# Patient Record
Sex: Male | Born: 1975 | Marital: Married | Smoking: Current some day smoker
Health system: Southern US, Community
[De-identification: ages and names within clinical notes are randomized; demographics above are authoritative.]

## PROBLEM LIST (undated history)

## (undated) DIAGNOSIS — R739 Hyperglycemia, unspecified: Secondary | ICD-10-CM

## (undated) HISTORY — DX: Hyperglycemia, unspecified: R73.9

## (undated) HISTORY — PX: NO PAST SURGERIES: SHX2092

---

## 2013-10-24 ENCOUNTER — Emergency Department: Payer: Self-pay | Admitting: Emergency Medicine

## 2013-10-24 LAB — COMPREHENSIVE METABOLIC PANEL
ALBUMIN: 4.2 g/dL (ref 3.4–5.0)
Alkaline Phosphatase: 62 U/L
Anion Gap: 6 — ABNORMAL LOW (ref 7–16)
BUN: 16 mg/dL (ref 7–18)
Bilirubin,Total: 0.7 mg/dL (ref 0.2–1.0)
CALCIUM: 9 mg/dL (ref 8.5–10.1)
Chloride: 108 mmol/L — ABNORMAL HIGH (ref 98–107)
Co2: 25 mmol/L (ref 21–32)
Creatinine: 0.84 mg/dL (ref 0.60–1.30)
EGFR (African American): >60
EGFR (Non-African Amer.): >60
Glucose: 115 mg/dL — ABNORMAL HIGH (ref 65–99)
OSMOLALITY: 280 (ref 275–301)
Potassium: 3.6 mmol/L (ref 3.5–5.1)
SGOT(AST): 34 U/L (ref 15–37)
SGPT (ALT): 30 U/L (ref 12–78)
Sodium: 139 mmol/L (ref 136–145)
Total Protein: 8.2 g/dL (ref 6.4–8.2)

## 2013-10-24 LAB — CBC WITH DIFFERENTIAL/PLATELET
BASOS ABS: 0 10*3/uL (ref 0.0–0.1)
BASOS PCT: 0.6 %
Eosinophil #: 0 10*3/uL (ref 0.0–0.7)
Eosinophil %: 0.7 %
HCT: 46 % (ref 40.0–52.0)
HGB: 15 g/dL (ref 13.0–18.0)
LYMPHS ABS: 2.3 10*3/uL (ref 1.0–3.6)
Lymphocyte %: 33.3 %
MCH: 30.5 pg (ref 26.0–34.0)
MCHC: 32.6 g/dL (ref 32.0–36.0)
MCV: 94 fL (ref 80–100)
MONOS PCT: 7.7 %
Monocyte #: 0.5 x10 3/mm (ref 0.2–1.0)
NEUTROS ABS: 4 10*3/uL (ref 1.4–6.5)
Neutrophil %: 57.7 %
Platelet: 143 10*3/uL — ABNORMAL LOW (ref 150–440)
RBC: 4.93 10*6/uL (ref 4.40–5.90)
RDW: 13.6 % (ref 11.5–14.5)
WBC: 6.8 10*3/uL (ref 3.8–10.6)

## 2013-10-27 ENCOUNTER — Emergency Department: Payer: Self-pay | Admitting: General Practice

## 2013-11-05 ENCOUNTER — Ambulatory Visit: Payer: Self-pay | Admitting: General Practice

## 2013-11-22 ENCOUNTER — Ambulatory Visit: Payer: Self-pay | Admitting: General Practice

## 2013-12-06 ENCOUNTER — Ambulatory Visit: Payer: Self-pay | Admitting: General Practice

## 2014-01-17 ENCOUNTER — Ambulatory Visit: Payer: Self-pay | Admitting: General Practice

## 2014-04-25 ENCOUNTER — Ambulatory Visit: Payer: Self-pay | Admitting: General Practice

## 2014-08-10 ENCOUNTER — Ambulatory Visit: Payer: Self-pay | Admitting: Family Medicine

## 2014-12-05 ENCOUNTER — Encounter: Payer: Self-pay | Admitting: Family Medicine

## 2014-12-05 ENCOUNTER — Ambulatory Visit: Payer: Self-pay | Admitting: Family Medicine

## 2014-12-05 ENCOUNTER — Ambulatory Visit (INDEPENDENT_AMBULATORY_CARE_PROVIDER_SITE_OTHER): Payer: 59 | Admitting: Family Medicine

## 2014-12-05 VITALS — BP 119/70 | HR 89 | Temp 97.4°F | Resp 18 | Ht 70.0 in | Wt 243.7 lb

## 2014-12-05 DIAGNOSIS — R8281 Pyuria: Principal | ICD-10-CM

## 2014-12-05 DIAGNOSIS — R8271 Bacteriuria: Secondary | ICD-10-CM | POA: Insufficient documentation

## 2014-12-05 DIAGNOSIS — N390 Urinary tract infection, site not specified: Secondary | ICD-10-CM

## 2014-12-05 NOTE — Progress Notes (Signed)
Name: Alexander SneddonVictor Salam Valenzuela   MRN: [id number]    DOB: 01/14/1976   Date:12/05/2014       Progress Note  Subjective  Chief Complaint  Chief Complaint  Patient presents with  . 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 He also found '15-20 WBCs' in his urine at the same time. No blood. He has noticed increased frequency of urination but no blood in urine, no fevers and chills. No history of STIs, kidney stones, or pyelonephritis.   Past Medical History  Diagnosis Date  . Hyperglycemia     History reviewed. No pertinent past surgical history.  Family History  Problem Relation Age of Onset  . Stroke Father   . Hypertension Father     History   Social History  . Marital Status: Married    Spouse Name: N/A  . Number of Children: N/A  . Years of Education: N/A   Occupational History  . Not on file.   Social History Main Topics  . Smoking status: Former Smoker    Quit date: 05/09/2011  . Smokeless tobacco: Never Used  . Alcohol Use: 0.0 oz/week    0 Standard drinks or equivalent per week     Comment: occasional  . Drug Use: No  . Sexual Activity:    Partners: Female     Comment: Married   Other Topics Concern  . Not on file   Social History Narrative  . No narrative on file    No current outpatient prescriptions on file.  No Known Allergies   Review of Systems  Constitutional: Negative for fever, chills, weight loss and malaise/fatigue.  Genitourinary: Positive for frequency. Negative for dysuria, urgency, hematuria and flank pain.      Objective  Filed Vitals:   12/05/14 1142  BP: 119/70  Pulse: 89  Temp: 97.4 F (36.3 C)  TempSrc: Oral  Resp: 18  Height: 5\' 10"  (1.778 m)  Weight: 243 lb 11.2 oz (110.542 kg)  SpO2: 96%    Physical Exam  Constitutional: He is oriented to person, place, and time  and well-developed, well-nourished, and in no distress.  Cardiovascular: Normal rate and regular rhythm.   Pulmonary/Chest: Effort normal and breath sounds normal.  Abdominal: Soft. Bowel sounds are normal.  Neurological: He is alert and oriented to person, place, and time.  Skin: Skin is warm and dry.  Nursing note and vitals reviewed.    Assessment & Plan 1. Bacteriuria with pyuria We will obtain urinalysis and culture for evaluation of bacteria in the urine.  - Urinalysis, microscopic only - Urine Culture   Rivaan Kendall Asad A. Faylene KurtzShah [facility] 12/05/2014 12:08 PM

## 2014-12-06 LAB — URINALYSIS, MICROSCOPIC ONLY: Casts: NONE SEEN /lpf

## 2014-12-07 LAB — URINE CULTURE: Organism ID, Bacteria: NO GROWTH

## 2014-12-08 ENCOUNTER — Telehealth: Payer: Self-pay | Admitting: Family Medicine

## 2014-12-08 LAB — URINALYSIS, COMPLETE
Bilirubin, UA: NEGATIVE
Glucose, UA: NEGATIVE
Ketones, UA: NEGATIVE
Nitrite, UA: NEGATIVE
Protein, UA: NEGATIVE
RBC, UA: NEGATIVE
Specific Gravity, UA: 1.022 (ref 1.005–1.030)
Urobilinogen, Ur: 0.2 mg/dL (ref 0.2–1.0)
pH, UA: 6 (ref 5.0–7.5)

## 2014-12-08 LAB — MICROSCOPIC EXAMINATION: Casts: NONE SEEN /lpf

## 2014-12-08 NOTE — Telephone Encounter (Signed)
Pt would like test results

## 2014-12-08 NOTE — Telephone Encounter (Signed)
Results have been reported to patient 

## 2014-12-10 LAB — SPECIMEN STATUS REPORT

## 2014-12-19 ENCOUNTER — Ambulatory Visit (INDEPENDENT_AMBULATORY_CARE_PROVIDER_SITE_OTHER): Payer: 59 | Admitting: Family Medicine

## 2014-12-19 ENCOUNTER — Encounter: Payer: Self-pay | Admitting: Family Medicine

## 2014-12-19 VITALS — BP 122/69 | HR 88 | Temp 97.7°F | Resp 18 | Ht 70.0 in | Wt 244.9 lb

## 2014-12-19 DIAGNOSIS — N390 Urinary tract infection, site not specified: Secondary | ICD-10-CM

## 2014-12-19 DIAGNOSIS — R8281 Pyuria: Secondary | ICD-10-CM

## 2014-12-19 DIAGNOSIS — R0789 Other chest pain: Secondary | ICD-10-CM | POA: Diagnosis not present

## 2014-12-19 DIAGNOSIS — G8929 Other chronic pain: Secondary | ICD-10-CM | POA: Diagnosis not present

## 2014-12-19 DIAGNOSIS — R8271 Bacteriuria: Secondary | ICD-10-CM

## 2014-12-19 MED ORDER — NAPROXEN 500 MG PO TABS
500.0000 mg | ORAL_TABLET | Freq: Two times a day (BID) | ORAL | Status: DC
Start: 2014-12-19 — End: 2015-02-09

## 2014-12-19 NOTE — Progress Notes (Signed)
Name: Alexander Valenzuela   MRN: [id number]    DOB: 1975/09/23   Date:12/19/2014       Progress Note  Subjective  Chief Complaint  Chief Complaint  Patient presents with  . Follow-up    2 wk/ urine results  . Chest Pain    left side    Chest Pain  This is a chronic problem. The current episode started more than 1 month ago. The onset quality is gradual. The problem occurs intermittently. The pain is present in the lateral region (left antero-lateral chest, no radiation.). The patient is experiencing no pain. The quality of the pain is described as sharp. The pain does not radiate. Associated symptoms include back pain (hx of middle back pain.). Pertinent negatives include no cough, fever, headaches or shortness of breath. The pain is aggravated by exertion, lifting arm and movement (started the day he was working on the ceiling fan which involved keeping his arms raised and stretched for a long period.). He has tried analgesics (ASA ) for the symptoms. The treatment provided mild relief.  Pertinent negatives for past medical history include no CAD, no CHF and no PE. Prior diagnostic workup includes chest x-ray.   Abnormal Urinalysis Pt. Is here for follow up of abnormal urinalysis, performed 2 weeks ago, which showed 6-10 WBC/hpf and a few bacteria but Culture was negative. No blood, nitrites, or leukocytes in the urine.  He has no fevers, chills, but does complain of increased frequency of urination since last week, no hesitancy, no gross hematuria, no flank pain. In addition, he also complains of a less strong ejaculation than before going back some months.     Past Medical History  Diagnosis Date  . Hyperglycemia     History reviewed. No pertinent past surgical history.  Family History  Problem Relation Age of Onset  . Stroke Father   . Hypertension Father     History   Social History  . Marital Status: Married    Spouse Name: N/A  . Number of Children: N/A  . Years  of Education: N/A   Occupational History  . Not on file.   Social History Main Topics  . Smoking status: Former Smoker    Quit date: 05/09/2011  . Smokeless tobacco: Never Used  . Alcohol Use: 0.0 oz/week    0 Standard drinks or equivalent per week     Comment: occasional  . Drug Use: No  . Sexual Activity:    Partners: Female     Comment: Married   Other Topics Concern  . Not on file   Social History Narrative     Current outpatient prescriptions:  .  ASPIRIN CHILDRENS PO, Take by mouth., Disp: , Rfl:   No Known Allergies   Review of Systems  Constitutional: Negative for fever and chills.  Respiratory: Negative for cough and shortness of breath.   Cardiovascular: Positive for chest pain.  Genitourinary: Positive for urgency and frequency. Negative for dysuria, hematuria and flank pain.  Musculoskeletal: Positive for back pain (hx of middle back pain.).  Neurological: Negative for headaches.      Objective  Filed Vitals:   12/19/14 1107  BP: 122/69  Pulse: 88  Temp: 97.7 F (36.5 C)  TempSrc: Oral  Resp: 18  Height:  (1.778 m)  Weight: 244 lb 14.4 oz (111.086 kg)  SpO2: 95%    Physical Exam  Constitutional: He is well-developed, well-nourished, and in no distress.  Cardiovascular: Normal rate and regular rhythm.  Pulmonary/Chest: Effort normal and breath sounds normal.  Abdominal: Soft. Bowel sounds are normal.  Nursing note and vitals reviewed.   Assessment & Plan  1. Chronic chest wall pain On take intermittent left-sided chest wall pain with negative x-ray and rib series. We advised an EKG for evaluation of the heart but patient wants to wait before obtaining any further studies. Pain is intermittent and most likely musculoskeletal in nature.we will start patient on high-dose NSAID therapy for 10 days and follow-up. - naproxen (NAPROSYN) 500 MG tablet; Take 1 tablet (500 mg total) by mouth 2 (two) times daily with a meal.  Dispense: 20  tablet; Refill: 0  2. Bacteriuria with pyuria Recheck urinalysis and follow-up. If still shows persistent bacteriuria, may start him on an empiric antibiotic therapy. - Urinalysis, Routine w reflex microscopic  Andrian Urbach Asad A[facility] 12/19/2014 12:00 PM

## 2014-12-21 LAB — URINALYSIS, ROUTINE W REFLEX MICROSCOPIC
BILIRUBIN UA: NEGATIVE
GLUCOSE, UA: NEGATIVE
Ketones, UA: NEGATIVE
NITRITE UA: NEGATIVE
Protein, UA: NEGATIVE
RBC, UA: NEGATIVE
SPEC GRAV UA: 1.023 (ref 1.005–1.030)
Urobilinogen, Ur: 0.2 mg/dL (ref 0.2–1.0)
pH, UA: 6 (ref 5.0–7.5)

## 2014-12-21 LAB — MICROSCOPIC EXAMINATION: Casts: NONE SEEN /lpf

## 2014-12-22 ENCOUNTER — Encounter: Payer: 59 | Admitting: Family Medicine

## 2014-12-22 ENCOUNTER — Telehealth: Payer: Self-pay | Admitting: Family Medicine

## 2014-12-22 DIAGNOSIS — R8271 Bacteriuria: Secondary | ICD-10-CM

## 2014-12-22 NOTE — Telephone Encounter (Signed)
Urine culture has been collected per Dr. Sherryll Burger

## 2014-12-23 ENCOUNTER — Telehealth: Payer: Self-pay | Admitting: Family Medicine

## 2014-12-23 NOTE — Telephone Encounter (Signed)
Spoke to patient informed him that test results have not come back yet and also that Dr. Sherryll Burger is out of the office until Tuesday 12/27/2014

## 2014-12-23 NOTE — Telephone Encounter (Signed)
Checking status on culture results. Please return call [phone]

## 2014-12-27 ENCOUNTER — Ambulatory Visit (INDEPENDENT_AMBULATORY_CARE_PROVIDER_SITE_OTHER): Payer: 59 | Admitting: Family Medicine

## 2014-12-27 ENCOUNTER — Encounter: Payer: Self-pay | Admitting: Family Medicine

## 2014-12-27 VITALS — BP 122/60 | HR 84 | Temp 97.8°F | Resp 18 | Ht 70.0 in | Wt 245.9 lb

## 2014-12-27 DIAGNOSIS — Z0000 Encounter for general adult medical examination without abnormal findings: Secondary | ICD-10-CM | POA: Diagnosis not present

## 2014-12-27 DIAGNOSIS — N390 Urinary tract infection, site not specified: Secondary | ICD-10-CM | POA: Diagnosis not present

## 2014-12-27 DIAGNOSIS — R8281 Pyuria: Secondary | ICD-10-CM

## 2014-12-27 DIAGNOSIS — R8271 Bacteriuria: Secondary | ICD-10-CM

## 2014-12-27 MED ORDER — CIPROFLOXACIN HCL 500 MG PO TABS: 500.0000 mg | ORAL_TABLET | Freq: Two times a day (BID) | ORAL | Status: DC

## 2014-12-27 NOTE — Progress Notes (Signed)
Name: Alexander Valenzuela   MRN: [id number]    DOB: 07/15/1975   Date:12/27/2014       Progress Note  Subjective  Chief Complaint  Chief Complaint  Patient presents with  . Annual Exam    CPE    HPI Pt. Is here for a Complete Physical Exam. He feels well overall.  Past Medical History  Diagnosis Date  . Hyperglycemia     History reviewed. No pertinent past surgical history.  Family History  Problem Relation Age of Onset  . Stroke Father   . Hypertension Father     History   Social History  . Marital Status: Married    Spouse Name: N/A  . Number of Children: N/A  . Years of Education: N/A   Occupational History  . Not on file.   Social History Main Topics  . Smoking status: Former Smoker    Quit date: 05/09/2011  . Smokeless tobacco: Never Used  . Alcohol Use: 0.0 oz/week    0 Standard drinks or equivalent per week     Comment: occasional  . Drug Use: No  . Sexual Activity:    Partners: Female     Comment: Married   Other Topics Concern  . Not on file   Social History Narrative     Current outpatient prescriptions:  .  naproxen (NAPROSYN) 500 MG tablet, Take 1 tablet (500 mg total) by mouth 2 (two) times daily with a meal., Disp: 20 tablet, Rfl: 0 .  ASPIRIN CHILDRENS PO, Take by mouth., Disp: , Rfl:   No Known Allergies   Review of Systems  Constitutional: Negative for fever and chills.  HENT: Negative for congestion and sore throat.   Eyes: Negative for blurred vision and double vision.  Respiratory: Negative for cough, sputum production and shortness of breath.   Cardiovascular: Negative for chest pain and palpitations.  Gastrointestinal: Negative for heartburn, nausea, vomiting, abdominal pain, diarrhea and constipation.  Genitourinary: Positive for frequency. Negative for dysuria, hematuria and flank pain.  Musculoskeletal: Positive for back pain. Negative for myalgias and neck pain.  Skin: Negative for rash.  Neurological: Negative for  dizziness, seizures, loss of consciousness and headaches.  Endo/Heme/Allergies: Does not bruise/bleed easily.  Psychiatric/Behavioral: Negative for depression. The patient is not nervous/anxious.      Objective  Filed Vitals:   12/27/14 1118  BP: 122/60  Pulse: 84  Temp: 97.8 F (36.6 C)  TempSrc: Oral  Resp: 18  Height:  (1.778 m)  Weight: 245 lb 14.4 oz (111.54 kg)  SpO2: 96%    Physical Exam  Constitutional: He is oriented to person, place, and time and well-developed, well-nourished, and in no distress.  HENT:  Head: Normocephalic.  Eyes: Pupils are equal, round, and reactive to light.  Neck: Normal range of motion. Neck supple.  Cardiovascular: Normal rate and regular rhythm.   Pulmonary/Chest: Effort normal and breath sounds normal.  Abdominal: Soft. Bowel sounds are normal.  Genitourinary: Rectum normal and prostate normal.  Musculoskeletal: Normal range of motion.  Neurological: He is alert and oriented to person, place, and time.  Skin: Skin is warm and dry.  Nursing note and vitals reviewed.    Assessment & Plan  1. Annual physical exam  - CBC with Differential - Basic Metabolic Panel (BMET) - PSA - Vitamin D (25 hydroxy) - Hepatic function panel - TSH  2. Bacteriuria with pyuria Urine culture shows no growth. Patient will be started on Cipro 500 mg by mouth twice  a day and repeat urinalysis in 2 weeks. - ciprofloxacin (CIPRO) 500 MG tablet; Take 1 tablet (500 mg total) by mouth 2 (two) times daily.  Dispense: 6 tablet; Refill: 0  Kaylynne Andres Asad [facility] 12/27/2014 11:35 AM

## 2014-12-30 LAB — CBC WITH DIFFERENTIAL/PLATELET
BASOS ABS: 0 10*3/uL (ref 0.0–0.2)
Basos: 0 %
EOS (ABSOLUTE): 0 10*3/uL (ref 0.0–0.4)
Eos: 1 %
Hematocrit: 44.4 % (ref 37.5–51.0)
Hemoglobin: 15.2 g/dL (ref 12.6–17.7)
Immature Grans (Abs): 0 10*3/uL (ref 0.0–0.1)
Immature Granulocytes: 0 %
LYMPHS ABS: 2.2 10*3/uL (ref 0.7–3.1)
Lymphs: 39 %
MCH: 31 pg (ref 26.6–33.0)
MCHC: 34.2 g/dL (ref 31.5–35.7)
MCV: 91 fL (ref 79–97)
Monocytes Absolute: 0.5 10*3/uL (ref 0.1–0.9)
Monocytes: 8 %
Neutrophils Absolute: 2.9 10*3/uL (ref 1.4–7.0)
Neutrophils: 52 %
Platelets: 173 10*3/uL (ref 150–379)
RBC: 4.9 x10E6/uL (ref 4.14–5.80)
RDW: 13.3 % (ref 12.3–15.4)
WBC: 5.6 10*3/uL (ref 3.4–10.8)

## 2014-12-31 LAB — BASIC METABOLIC PANEL
BUN/Creatinine Ratio: 15 (ref 8–19)
BUN: 12 mg/dL (ref 6–20)
CHLORIDE: 101 mmol/L (ref 97–108)
CO2: 25 mmol/L (ref 18–29)
Calcium: 9.4 mg/dL (ref 8.7–10.2)
Creatinine, Ser: 0.8 mg/dL (ref 0.76–1.27)
GFR calc Af Amer: 130 mL/min/{1.73_m2} (ref >59)
GFR calc non Af Amer: 113 mL/min/{1.73_m2} (ref >59)
GLUCOSE: 110 mg/dL — AB (ref 65–99)
Potassium: 4.5 mmol/L (ref 3.5–5.2)
Sodium: 140 mmol/L (ref 134–144)

## 2014-12-31 LAB — HEPATIC FUNCTION PANEL
ALBUMIN: 4.7 g/dL (ref 3.5–5.5)
ALT: 28 IU/L (ref 0–44)
AST: 21 IU/L (ref 0–40)
Alkaline Phosphatase: 71 IU/L (ref 39–117)
BILIRUBIN, DIRECT: 0.14 mg/dL (ref 0.00–0.40)
Bilirubin Total: 0.5 mg/dL (ref 0.0–1.2)
TOTAL PROTEIN: 7.1 g/dL (ref 6.0–8.5)

## 2014-12-31 LAB — PSA: Prostate Specific Ag, Serum: 0.5 ng/mL (ref 0.0–4.0)

## 2014-12-31 LAB — TSH: TSH: 1.4 u[IU]/mL (ref 0.450–4.500)

## 2014-12-31 LAB — VITAMIN D 25 HYDROXY (VIT D DEFICIENCY, FRACTURES): VIT D 25 HYDROXY: 16 ng/mL — AB (ref 30.0–100.0)

## 2015-01-04 ENCOUNTER — Other Ambulatory Visit: Payer: Self-pay | Admitting: Family Medicine

## 2015-01-04 MED ORDER — VITAMIN D (ERGOCALCIFEROL) 1.25 MG (50000 UNIT) PO CAPS: 50000.0000 [IU] | ORAL_CAPSULE | ORAL | Status: DC

## 2015-01-04 NOTE — Telephone Encounter (Signed)
Per Dr. Sherryll Burger a prescription for Vitamin D 50,000 units has been sent to [facility] [address]

## 2015-01-10 ENCOUNTER — Encounter: Payer: Self-pay | Admitting: Family Medicine

## 2015-01-10 ENCOUNTER — Other Ambulatory Visit: Payer: Self-pay | Admitting: Family Medicine

## 2015-01-10 ENCOUNTER — Encounter: Payer: 59 | Admitting: Family Medicine

## 2015-01-10 DIAGNOSIS — R8281 Pyuria: Principal | ICD-10-CM

## 2015-01-10 DIAGNOSIS — R8271 Bacteriuria: Secondary | ICD-10-CM

## 2015-01-10 NOTE — Progress Notes (Signed)
This encounter was created in error - please disregard.

## 2015-01-17 ENCOUNTER — Encounter: Payer: Self-pay | Admitting: Family Medicine

## 2015-02-09 ENCOUNTER — Other Ambulatory Visit: Payer: Self-pay | Admitting: Family Medicine

## 2015-02-09 ENCOUNTER — Encounter: Payer: Self-pay | Admitting: Family Medicine

## 2015-02-09 ENCOUNTER — Ambulatory Visit (INDEPENDENT_AMBULATORY_CARE_PROVIDER_SITE_OTHER): Payer: 59 | Admitting: Family Medicine

## 2015-02-09 VITALS — BP 123/66 | HR 107 | Temp 98.4°F | Resp 19 | Ht 70.0 in | Wt 249.6 lb

## 2015-02-09 DIAGNOSIS — N390 Urinary tract infection, site not specified: Secondary | ICD-10-CM

## 2015-02-09 DIAGNOSIS — R8271 Bacteriuria: Secondary | ICD-10-CM

## 2015-02-09 NOTE — Progress Notes (Signed)
Name: Alexander Valenzuela   MRN: [id number]    DOB: Feb 24, 1976   Date:02/09/2015       Progress Note  Subjective  Chief Complaint  Chief Complaint  Patient presents with  . Follow-up    Urine culture    HPI Pt. With complaints of discomfort with urination, present for some time, couples with burning at the end of urination at times.  He is a Consulting civil engineer at [employer] and tested his urine culture, which showed Corynebacterium urealyticum as the causative organism after 48 hour growth cycle.Gram stain showed V and L-shaped gram + rods. [facility] is requesting a repeat urine culture for positive identification of the organism  Past Medical History  Diagnosis Date  . Hyperglycemia     History reviewed. No pertinent past surgical history.  Family History  Problem Relation Age of Onset  . Stroke Father   . Hypertension Father     Social History   Social History  . Marital Status: Married    Spouse Name: N/A  . Number of Children: N/A  . Years of Education: N/A   Occupational History  . Not on file.   Social History Main Topics  . Smoking status: Former Smoker    Quit date: 05/09/2011  . Smokeless tobacco: Never Used  . Alcohol Use: 0.0 oz/week    0 Standard drinks or equivalent per week     Comment: occasional  . Drug Use: No  . Sexual Activity:    Partners: Female     Comment: Married   Other Topics Concern  . Not on file   Social History Narrative    Current outpatient prescriptions:  .  ASPIRIN CHILDRENS PO, Take by mouth., Disp: , Rfl:  .  Vitamin D, Ergocalciferol, (DRISDOL) 50000 UNITS CAPS capsule, Take 1 capsule (50,000 Units total) by mouth once a week. For 12 weeks, Disp: 12 capsule, Rfl: 0 .  ciprofloxacin (CIPRO) 500 MG tablet, Take 1 tablet (500 mg total) by mouth 2 (two) times daily. (Patient not taking: Reported on 02/09/2015), Disp: 6 tablet, Rfl: 0 .  naproxen (NAPROSYN) 500 MG tablet, Take 1 tablet (500 mg total) by mouth 2 (two) times daily with a meal.  (Patient not taking: Reported on 02/09/2015), Disp: 20 tablet, Rfl: 0  No Known Allergies   Review of Systems  Constitutional: Negative for fever and chills.  Genitourinary: Positive for dysuria, frequency and flank pain. Negative for hematuria.   Objective  Filed Vitals:   02/09/15 1434  BP: 123/66  Pulse: 107  Temp: 98.4 F (36.9 C)  TempSrc: Oral  Resp: 19  Height:  (1.778 m)  Weight: 249 lb 9.6 oz (113.218 kg)  SpO2: 95%    Physical Exam  Constitutional: He is well-developed, well-nourished, and in no distress.  Cardiovascular: Normal rate and regular rhythm.   Pulmonary/Chest: Effort normal and breath sounds normal.  Abdominal: Soft. Bowel sounds are normal. There is no CVA tenderness.  Nursing note and vitals reviewed.  Assessment & Plan  1. Bacteriuria Obtain urinalysis and culture for evaluation of the suspected bacterium. - Urinalysis, Routine w reflex microscopic - Urine Culture   Syed Asad A[facility] 02/09/2015 2:48 PM

## 2015-02-10 LAB — URINALYSIS, ROUTINE W REFLEX MICROSCOPIC
Bilirubin, UA: NEGATIVE
GLUCOSE, UA: NEGATIVE
Ketones, UA: NEGATIVE
Leukocytes, UA: NEGATIVE
NITRITE UA: NEGATIVE
PH UA: 5.5 (ref 5.0–7.5)
Protein, UA: NEGATIVE
RBC, UA: NEGATIVE
Specific Gravity, UA: 1.024 (ref 1.005–1.030)
UUROB: 0.2 mg/dL (ref 0.2–1.0)

## 2015-02-11 LAB — URINE CULTURE: Organism ID, Bacteria: NO GROWTH

## 2015-02-16 ENCOUNTER — Telehealth: Payer: Self-pay | Admitting: Family Medicine

## 2015-02-16 NOTE — Telephone Encounter (Signed)
Pt would like a call back as soon as you can. He says he has been dealing with same symptoms for 2 mths and its not getting better. He would like a call back as soon as possible.

## 2015-02-17 NOTE — Telephone Encounter (Signed)
Called and left a voice message

## 2015-02-17 NOTE — Telephone Encounter (Signed)
Routed to Dr. Shah for advice  

## 2015-02-22 ENCOUNTER — Other Ambulatory Visit: Payer: Self-pay | Admitting: Family Medicine

## 2015-02-22 DIAGNOSIS — R8271 Bacteriuria: Secondary | ICD-10-CM

## 2015-02-22 DIAGNOSIS — R8281 Pyuria: Principal | ICD-10-CM

## 2015-02-23 ENCOUNTER — Other Ambulatory Visit: Payer: Self-pay | Admitting: Family Medicine

## 2015-02-25 LAB — URINE CULTURE: ORGANISM ID, BACTERIA: NO GROWTH

## 2015-03-10 ENCOUNTER — Ambulatory Visit (INDEPENDENT_AMBULATORY_CARE_PROVIDER_SITE_OTHER): Payer: 59 | Admitting: Family Medicine

## 2015-03-10 ENCOUNTER — Encounter: Payer: Self-pay | Admitting: Family Medicine

## 2015-03-10 VITALS — BP 122/75 | HR 89 | Temp 97.7°F | Resp 18 | Ht 70.0 in | Wt 252.5 lb

## 2015-03-10 DIAGNOSIS — E669 Obesity, unspecified: Secondary | ICD-10-CM

## 2015-03-10 NOTE — Progress Notes (Signed)
Name: Alexander SneddonVictor Salam Valenzuela   MRN: [id number]    DOB: 11/17/1975   Date:03/10/2015       Progress Note  Subjective  Chief Complaint  Chief Complaint  Patient presents with  . Advice Only    Paperwork    HPI  Pt. Is here for appealing the [facility]'s BMI wellness goal. Current weight is 252lbs and BMI of 36.23. He does not eat out or have fast food, his wife cooks for him. He is busy with FT school and employment which leaves him with very little time for any physical activity or exercise. He is going to start with an exercise regimen soon.  Past Medical History  Diagnosis Date  . Hyperglycemia     History reviewed. No pertinent past surgical history.  Family History  Problem Relation Age of Onset  . Stroke Father   . Hypertension Father     Social History   Social History  . Marital Status: Married    Spouse Name: N/A  . Number of Children: N/A  . Years of Education: N/A   Occupational History  . Not on file.   Social History Main Topics  . Smoking status: Former Smoker    Quit date: 05/09/2011  . Smokeless tobacco: Never Used  . Alcohol Use: 0.0 oz/week    0 Standard drinks or equivalent per week     Comment: occasional  . Drug Use: No  . Sexual Activity:    Partners: Female     Comment: Married   Other Topics Concern  . Not on file   Social History Narrative     Current outpatient prescriptions:  .  ASPIRIN CHILDRENS PO, Take by mouth., Disp: , Rfl:  .  Vitamin D, Ergocalciferol, (DRISDOL) 50000 UNITS CAPS capsule, Take 1 capsule (50,000 Units total) by mouth once a week. For 12 weeks, Disp: 12 capsule, Rfl: 0  No Known Allergies   Review of Systems  Constitutional: Negative for fever, chills and weight loss.  Cardiovascular: Negative for chest pain.   Objective  Filed Vitals:   03/10/15 0932  BP: 122/75  Pulse: 89  Temp: 97.7 F (36.5 C)  TempSrc: Oral  Resp: 18  Height: 5\' 10"  (1.778 m)  Weight: 252 lb 8 oz (114.533 kg)  SpO2: 97%     Physical Exam  Constitutional: He is oriented to person, place, and time and well-developed, well-nourished, and in no distress.  Cardiovascular: Normal rate and regular rhythm.   No murmur heard. Pulmonary/Chest: Effort normal and breath sounds normal. He has no wheezes. He has no rales.  Neurological: He is alert and oriented to person, place, and time.  Skin: Skin is warm and dry.  Nursing note and vitals reviewed.   Assessment & Plan  1. Obesity (BMI 35.0-39.9 without comorbidity) (HCC) Recommended that patient start a program of graduated physical activity including walking,, swimming, treadmill etc. Continue current diet. Recheck in 3 months.   Shawne Eskelson Asad A. Faylene KurtzShah [facility] 03/10/2015 9:52 AM

## 2015-06-21 ENCOUNTER — Ambulatory Visit: Payer: 59 | Admitting: Family Medicine

## 2015-06-21 NOTE — Progress Notes (Signed)
Name: Alexander Valenzuela   MRN: [id number]    DOB: 1975/11/06   Date:06/21/2015       Progress Note  Subjective  Chief Complaint  Chief Complaint  Patient presents with  . discuss immunizations    HPI    Past Medical History  Diagnosis Date  . Hyperglycemia     No past surgical history on file.  Family History  Problem Relation Age of Onset  . Stroke Father   . Hypertension Father     Social History   Social History  . Marital Status: Married    Spouse Name: N/A  . Number of Children: N/A  . Years of Education: N/A   Occupational History  . Not on file.   Social History Main Topics  . Smoking status: Former Smoker    Quit date: 05/09/2011  . Smokeless tobacco: Never Used  . Alcohol Use: 0.0 oz/week    0 Standard drinks or equivalent per week     Comment: occasional  . Drug Use: No  . Sexual Activity:    Partners: Female     Comment: Married   Other Topics Concern  . Not on file   Social History Narrative     Current outpatient prescriptions:  .  ASPIRIN CHILDRENS PO, Take by mouth., Disp: , Rfl:  .  Vitamin D, Ergocalciferol, (DRISDOL) 50000 UNITS CAPS capsule, Take 1 capsule (50,000 Units total) by mouth once a week. For 12 weeks, Disp: 12 capsule, Rfl: 0  No Known Allergies   ROS    Objective  Filed Vitals:   06/21/15 1319  BP: 122/69  Pulse: 98  Temp: 98.4 F (36.9 C)  Resp: 18  Height:  (1.778 m)  Weight: 250 lb 9 oz (113.654 kg)  SpO2: 96%    Physical Exam     No results found for this or any previous visit (from the past 2160 hour(s)).   Assessment & Plan  There are no diagnoses linked to this encounter.  Coleman Kalas Asad A. [facility] 06/21/2015 1:35 PM This encounter was created in error - please disregard.

## 2015-07-18 ENCOUNTER — Ambulatory Visit: Payer: 59 | Admitting: Obstetrics and Gynecology

## 2015-07-19 ENCOUNTER — Encounter: Payer: Self-pay | Admitting: Obstetrics and Gynecology

## 2015-07-19 ENCOUNTER — Ambulatory Visit (INDEPENDENT_AMBULATORY_CARE_PROVIDER_SITE_OTHER): Payer: 59 | Admitting: Obstetrics and Gynecology

## 2015-07-19 VITALS — BP 120/76 | HR 92 | Resp 16 | Ht 70.0 in | Wt 248.1 lb

## 2015-07-19 DIAGNOSIS — R3911 Hesitancy of micturition: Secondary | ICD-10-CM | POA: Diagnosis not present

## 2015-07-19 LAB — BLADDER SCAN AMB NON-IMAGING: Scan Result: 108

## 2015-07-19 NOTE — Progress Notes (Signed)
07/19/2015 2:32 PM   Alexander Valenzuela July 22, 1975 [id number]  Referring provider: Ellyn Hack, MD [address] [place], [place] [address]  Chief Complaint  Patient presents with  . Urinary hesitancy  . Establish Care    HPI: Patient is a 40 year old male presenting today with complaints of weak urinary stream. He reports that he is fixed experiencing a weak urinary stream for the last few years and also occasionally experiences postvoid dribbling. He very rarely has some leakage (few drops) associated with urge incontinence.  Additional complaints include some decreased ejaculate volume. Patient reports that he has semen analysis performed at his wife's GYN provider's office and states that his sperm counts for normal but his volume was low. He is unsure of the exact results. There are no available reports at this time.  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 He states that he cultured his own urine and left it in daily to 48 hours and a groove a slow growing bacteria greater than 100,000 colonies. He states that he has been to see his primary care provider numerous times and had multiple urine cultures sent to lab core but states that they never incubated his urine long enough even when specifically requested. He is concerned that he may have a urinary tract infection and his cultures are not indicative of this because they are not allow to daily long enough.  I-PSS: 13 QOL: 3 mixed    PMH: Past Medical History  Diagnosis Date  . Hyperglycemia     Surgical History: History reviewed. No pertinent past surgical history.  Home Medications:    Medication List       This list is accurate as of: 07/19/15 11:59 PM.  Always use your most recent med list.               ASPIRIN CHILDRENS PO  Take by mouth.     Vitamin D (Ergocalciferol) 50000 units Caps capsule  Commonly known as:  DRISDOL  Take 1 capsule (50,000 Units  total) by mouth once a week. For 12 weeks        Allergies: No Known Allergies  Family History: Family History  Problem Relation Age of Onset  . Stroke Father   . Hypertension Father     Social History:  reports that he quit smoking about 4 years ago. He has never used smokeless tobacco. He reports that he drinks alcohol. He reports that he does not use illicit drugs.  ROS: UROLOGY Frequent Urination?: No Hard to postpone urination?: No Burning/pain with urination?: No Get up at night to urinate?: No Leakage of urine?: Yes Urine stream starts and stops?: No Trouble starting stream?: No Do you have to strain to urinate?: Yes Blood in urine?: No Urinary tract infection?: No Sexually transmitted disease?: No Injury to kidneys or bladder?: No Painful intercourse?: No Weak stream?: Yes Erection problems?: No Penile pain?: No  Gastrointestinal Nausea?: No Vomiting?: No Indigestion/heartburn?: No Diarrhea?: No Constipation?: No  Constitutional Fever: No Night sweats?: No Weight loss?: No Fatigue?: No  Skin Skin rash/lesions?: No Itching?: No  Eyes Blurred vision?: No Double vision?: No  Ears/Nose/Throat Sore throat?: No Sinus problems?: No  Hematologic/Lymphatic Swollen glands?: No Easy bruising?: No  Cardiovascular Leg swelling?: No Chest pain?: No  Respiratory Cough?: No Shortness of breath?: No  Endocrine Excessive thirst?: No  Musculoskeletal Back pain?: Yes Joint pain?: No  Neurological Headaches?: No Dizziness?: No  Psychologic  Depression?: No Anxiety?: No  Physical Exam: BP 120/76 mmHg  Pulse 92  Resp 16  Ht  (1.778 m)  Wt 248 lb 1.6 oz (112.537 kg)  BMI 35.60 kg/m2  Constitutional:  Alert and oriented, No acute distress. HEENT: [place] AT, moist mucus membranes.  Trachea midline, no masses. Cardiovascular: No clubbing, cyanosis, or edema. Respiratory: Normal respiratory effort, no increased work of breathing. GI:  Abdomen is soft, nontender, nondistended, no abdominal masses GU: No CVA tenderness.   straight phallus, testicles descended bilaterally without palpable masses or tenderness DRE: small, smooth, non tender prostate Skin: No rashes, bruises or suspicious lesions. Lymph: No cervical or inguinal adenopathy. Neurologic: Grossly intact, no focal deficits, moving all 4 extremities. Psychiatric: Normal mood and affect.  Laboratory Data:  Lab Results  Component Value Date   WBC 5.6 12/30/2014   HGB 15.0 10/24/2013   HCT 44.4 12/30/2014   MCV 91 12/30/2014   PLT 173 12/30/2014    Lab Results  Component Value Date   CREATININE 0.80 12/30/2014    No results found for: PSA  No results found for: TESTOSTERONE  No results found for: HGBA1C  Urinalysis    Component Value Date/Time   GLUCOSEU Negative 07/19/2015 1449   BILIRUBINUR Negative 07/19/2015 1449   NITRITE Negative 07/19/2015 1449   LEUKOCYTESUR Negative 07/19/2015 1449    Pertinent Imaging:   Assessment & Plan:    1. Urinary hesitancy-  Rapaflo prescribed today. Recheck 2 weeks and UROFLOW.   - Urinalysis, Complete - BLADDER SCAN AMB NON-IMAGING  Requesting patient obtain a semen analysis report for review. He may need a prostate ultrasound for further evaluation of possible ejaculatory duct obstruction.  I personally called and verified with Levita at [facility] that patient's urine culture will be incubated for at least 48hrs if it was written on requisitition form.     Return in about 2 weeks (around 08/02/2015) for recheck weaks tream/UROFLOW.  These notes generated with voice recognition software. I apologize for typographical errors.  Earlie Lou, FNP  [facility] [address] [place], [place] [address] [phone]

## 2015-07-20 ENCOUNTER — Telehealth: Payer: Self-pay | Admitting: Obstetrics and Gynecology

## 2015-07-20 LAB — URINALYSIS, COMPLETE
Bilirubin, UA: NEGATIVE
Glucose, UA: NEGATIVE
KETONES UA: NEGATIVE
LEUKOCYTES UA: NEGATIVE
NITRITE UA: NEGATIVE
PH UA: 5.5 (ref 5.0–7.5)
RBC UA: NEGATIVE
Specific Gravity, UA: >1.03 — ABNORMAL HIGH (ref 1.005–1.030)
Urobilinogen, Ur: 0.2 mg/dL (ref 0.2–1.0)

## 2015-07-20 LAB — MICROSCOPIC EXAMINATION

## 2015-07-20 NOTE — Telephone Encounter (Signed)
We please call patient and see if he can get a copy of his previous semen analysis results and bring them with him to his follow-up appointment for [place] to review. Thanks

## 2015-07-21 LAB — CULTURE, URINE COMPREHENSIVE

## 2015-07-21 NOTE — Telephone Encounter (Signed)
LMOM

## 2015-07-21 NOTE — Telephone Encounter (Signed)
Spoke with pt in reference to semen analysis. Pt stated that he would bring them to his next appt.

## 2015-07-25 ENCOUNTER — Telehealth: Payer: Self-pay

## 2015-07-25 NOTE — Telephone Encounter (Signed)
Spoke with pt in reference to -ucx. Pt voiced understanding.  

## 2015-07-25 NOTE — Telephone Encounter (Signed)
Not able to leave a message due to box full.

## 2015-07-25 NOTE — Telephone Encounter (Signed)
-----   Message from Fernanda DrumLindsay C Overton, FNP sent at 07/25/2015 10:45 AM EST ----- Please notify patient that his urine culture was negative for bacterial growth.

## 2015-08-02 ENCOUNTER — Ambulatory Visit (INDEPENDENT_AMBULATORY_CARE_PROVIDER_SITE_OTHER): Payer: 59 | Admitting: Obstetrics and Gynecology

## 2015-08-02 ENCOUNTER — Ambulatory Visit: Payer: 59 | Admitting: Obstetrics and Gynecology

## 2015-08-02 ENCOUNTER — Encounter: Payer: Self-pay | Admitting: Obstetrics and Gynecology

## 2015-08-02 VITALS — BP 108/76 | HR 80 | Ht 68.0 in | Wt 251.0 lb

## 2015-08-02 DIAGNOSIS — N538 Other male sexual dysfunction: Secondary | ICD-10-CM | POA: Diagnosis not present

## 2015-08-02 DIAGNOSIS — R3912 Poor urinary stream: Secondary | ICD-10-CM | POA: Diagnosis not present

## 2015-08-02 DIAGNOSIS — R868 Other abnormal findings in specimens from male genital organs: Secondary | ICD-10-CM

## 2015-08-02 NOTE — Progress Notes (Signed)
Uroflow  Peak Flow: 3.240ml Average Flow: 3.748ml Voided Volume: 193.360ml Voiding Time: 74.5sec Flow Time: 51.0sec Time to Peak Flow: 3.8sec

## 2015-08-02 NOTE — Patient Instructions (Signed)
Cystoscopy  Cystoscopy is a procedure that is used to help your caregiver diagnose and sometimes treat conditions that affect your lower urinary tract. Your lower urinary tract includes your bladder and the tube through which urine passes from your bladder out of your body (urethra). Cystoscopy is performed with a thin, tube-shaped instrument (cystoscope). The cystoscope has lenses and a light at the end so that your caregiver can see inside your bladder. The cystoscope is inserted at the entrance of your urethra. Your caregiver guides it through your urethra and into your bladder. There are two main types of cystoscopy:  · Flexible cystoscopy (with a flexible cystoscope).  · Rigid cystoscopy (with a rigid cystoscope).  Cystoscopy may be recommended for many conditions, including:  · Urinary tract infections.  · Blood in your urine (hematuria).  · Loss of bladder control (urinary incontinence) or overactive bladder.  · Unusual cells found in a urine sample.  · Urinary blockage.  · Painful urination.  Cystoscopy may also be done to remove a sample of your tissue to be checked under a microscope (biopsy). It may also be done to remove or destroy bladder stones.  LET YOUR CAREGIVER KNOW ABOUT:  · Allergies to food or medicine.  · Medicines taken, including vitamins, herbs, eyedrops, over-the-counter medicines, and creams.  · Use of steroids (by mouth or creams).  · Previous problems with anesthetics or numbing medicines.  · History of bleeding problems or blood clots.  · Previous surgery.  · Other health problems, including diabetes and kidney problems.  · Possibility of pregnancy, if this applies.  PROCEDURE  The area around the opening to your urethra will be cleaned. A medicine to numb your urethra (local anesthetic) is used. If a tissue sample or stone is removed during the procedure, you may be given a medicine to make you sleep (general anesthetic).  Your caregiver will gently insert the tip of the cystoscope  into your urethra. The cystoscope will be slowly glided through your urethra and into your bladder. Sterile fluid will flow through the cystoscope and into your bladder. The fluid will expand and stretch your bladder. This gives your caregiver a better view of your bladder walls. The procedure lasts about 15-20 minutes.  AFTER THE PROCEDURE  If a local anesthetic is used, you will be allowed to go home as soon as you are ready. If a general anesthetic is used, you will be taken to a recovery area until you are stable. You may have temporary bleeding and burning on urination.     This information is not intended to replace advice given to you by your health care provider. Make sure you discuss any questions you have with your health care provider.     Document Released: 05/03/2000 Document Revised: 05/27/2014 Document Reviewed: 10/28/2011  Elsevier Interactive Patient Education ©2016 [employer]vier Inc[employer]

## 2015-08-02 NOTE — Progress Notes (Signed)
11:53 AM   [facility] FerraraVictor Salam [facility] 03/19/1976 [id number]  Referring provider: Ellyn HackSyed Asad A Shah, MD [address] [place], [place] [id number]  Chief Complaint  Patient presents with  . Uroflow    HPI: Patient is a 40 year old male presenting today with complaints of weak urinary stream. He reports that he is fixed experiencing a weak urinary stream for the last few years and also occasionally experiences postvoid dribbling. He very rarely has some leakage (few drops) associated with urge incontinence.  Additional complaints include some decreased ejaculate volume. Patient reports that he has semen analysis performed at his wife's GYN provider's office and states that his sperm counts for normal but his volume was low. He is unsure of the exact results. There are no available reports at this time.  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 He states that he cultured his own urine and left it in daily to 48 hours and a groove a slow growing bacteria greater than 100,000 colonies. He states that he has been to see his primary care provider numerous times and had multiple urine cultures sent to lab core but states that they never incubated his urine long enough even when specifically requested. He is concerned that he may have a urinary tract infection and his cultures are not indicative of this because they are not allow to daily long enough.  I-PSS: 13 QOL: 3 mixed   Interval History 08/02/15  Patient presents today for uroflow evaluation and follow-up for weak urinary stream and decreased ejaculate volume. He has brought his semen analysis with him today. He reports no change in symptoms since his last visit. He did experience retrograde ejaculation when he was taking Rapaflo and no improvement in his urinary symptoms.  PMH: Past Medical History  Diagnosis Date  . Hyperglycemia     Surgical History: No past surgical history on file.  Home  Medications:    Medication List       This list is accurate as of: 08/02/15 11:53 AM.  Always use your most recent med list.               ASPIRIN CHILDRENS PO  Take by mouth. Reported on 08/02/2015     Vitamin D (Ergocalciferol) 50000 units Caps capsule  Commonly known as:  DRISDOL  Take 1 capsule (50,000 Units total) by mouth once a week. For 12 weeks        Allergies: No Known Allergies  Family History: Family History  Problem Relation Age of Onset  . Stroke Father   . Hypertension Father     Social History:  reports that he quit smoking about 4 years ago. He has never used smokeless tobacco. He reports that he drinks alcohol. He reports that he does not use illicit drugs.  ROS: UROLOGY Frequent Urination?: No Hard to postpone urination?: No Burning/pain with urination?: No Get up at night to urinate?: No Leakage of urine?: No Urine stream starts and stops?: No Trouble starting stream?: No Do you have to strain to urinate?: No Blood in urine?: No Urinary tract infection?: No Sexually transmitted disease?: No Injury to kidneys or bladder?: No Painful intercourse?: No Weak stream?: Yes Erection problems?: No Penile pain?: No  Gastrointestinal Nausea?: No Vomiting?: No Indigestion/heartburn?: No Diarrhea?: No Constipation?: No  Constitutional Fever: No Night sweats?: No Weight loss?: No Fatigue?: No  Skin Skin rash/lesions?: No Itching?: No  Eyes Blurred vision?: No Double vision?: No  Ears/Nose/Throat Sore throat?: No Sinus problems?: No  Hematologic/Lymphatic Swollen glands?: No Easy bruising?: No  Cardiovascular Leg swelling?: No Chest pain?: No  Respiratory Cough?: No Shortness of breath?: No  Endocrine Excessive thirst?: No  Musculoskeletal Back pain?: Yes Joint pain?: No  Neurological Headaches?: No Dizziness?: No  Psychologic Depression?: No Anxiety?: No  Physical Exam: BP 108/76 mmHg  Pulse 80  Ht   (1.727 m)  Wt 251 lb (113.853 kg)  BMI 38.17 kg/m2  Constitutional:  Alert and oriented, No acute distress. HEENT: [place] AT, moist mucus membranes.  Trachea midline, no masses. Cardiovascular: No clubbing, cyanosis, or edema. Respiratory: Normal respiratory effort, no increased work of breathing. GI: Abdomen is soft, nontender, nondistended, no abdominal masses  Skin: No rashes, bruises or suspicious lesions. Neurologic: Grossly intact, no focal deficits, moving all 4 extremities. Psychiatric: Normal mood and affect.  Laboratory Data:  Lab Results  Component Value Date   WBC 5.6 12/30/2014   HGB 15.0 10/24/2013   HCT 44.4 12/30/2014   MCV 91 12/30/2014   PLT 173 12/30/2014    Lab Results  Component Value Date   CREATININE 0.80 12/30/2014    No results found for: PSA  No results found for: TESTOSTERONE  No results found for: HGBA1C  Urinalysis    Component Value Date/Time   GLUCOSEU Negative 07/19/2015 1449   BILIRUBINUR Negative 07/19/2015 1449   NITRITE Negative 07/19/2015 1449   LEUKOCYTESUR Negative 07/19/2015 1449    Pertinent Imaging:   Assessment & Plan:    1. Urinary hesitancy-    -Uroflow demonstrating obstructive pattern with peak and average flow of only .  2. Decreased ejaculate volume-   -0.95ml -per guidelines low ejaculate volumes indicative of possible urethral stricture vs obstructive ejaculatory duct  Will proceed with cystoscopy for further evaluation of weak stream and decreased ejaculate volume.  Return for schedule cystoscopy possible urethral stricture.  These notes generated with voice recognition software. I apologize for typographical errors.  Earlie Lou, FNP  [facility] [address] [place], [place] [address] [phone]

## 2015-08-15 ENCOUNTER — Ambulatory Visit (INDEPENDENT_AMBULATORY_CARE_PROVIDER_SITE_OTHER): Payer: 59 | Admitting: Urology

## 2015-08-15 VITALS — BP 121/78 | HR 79 | Ht 68.0 in | Wt 249.0 lb

## 2015-08-15 DIAGNOSIS — N358 Other urethral stricture: Secondary | ICD-10-CM

## 2015-08-15 LAB — URINALYSIS, COMPLETE
Bilirubin, UA: NEGATIVE
GLUCOSE, UA: NEGATIVE
Ketones, UA: NEGATIVE
NITRITE UA: NEGATIVE
RBC, UA: NEGATIVE
Specific Gravity, UA: 1.025 (ref 1.005–1.030)
Urobilinogen, Ur: 0.2 mg/dL (ref 0.2–1.0)
pH, UA: 6 (ref 5.0–7.5)

## 2015-08-15 LAB — MICROSCOPIC EXAMINATION: Epithelial Cells (non renal): NONE SEEN /hpf (ref 0–10)

## 2015-08-15 MED ORDER — CIPROFLOXACIN HCL 500 MG PO TABS
500.0000 mg | ORAL_TABLET | Freq: Once | ORAL | Status: AC
Start: 2015-08-15 — End: 2015-08-15
  Administered 2015-08-15: 500 mg via ORAL

## 2015-08-15 MED ORDER — LIDOCAINE HCL 2 % EX GEL
1.0000 "application " | Freq: Once | CUTANEOUS | Status: AC
  Administered 2015-08-15: 1 via URETHRAL

## 2015-08-17 ENCOUNTER — Encounter: Payer: Self-pay | Admitting: Urology

## 2015-08-17 NOTE — Progress Notes (Signed)
08/15/2015 3:10 PM   Alexander FerraraVictor Valenzuela [facility] 11/17/1975 [id number]  Referring provider: Ellyn HackSyed Asad A Shah, MD [address] [place], [place] [address]  Chief Complaint  Patient presents with  . Cysto    HPI: 40 year old male with a history of weak urinary stream and decreased ejaculate volume. As part of the workup, he presents today for cystoscopy to rule out urethral stricture.  He does note today that history and has been worsening and caliber over the past few years. He does feel like he is able to empty his bladder completely.  Previous uroflow show s a peak flow of only 3 mL's per second with a very prolonged voiding time.  Also shows an obstructive voiding pattern.  PVR previously 108 mL.  Patient denies a history of sexual transmitted infections and urethral/perineal trauma.   PMH: Past Medical History  Diagnosis Date  . Hyperglycemia     Surgical History: No past surgical history on file.  Home Medications:    Medication List       This list is accurate as of: 08/15/15 11:59 PM.  Always use your most recent med list.               ASPIRIN CHILDRENS PO  Take by mouth. Reported on 08/02/2015     Vitamin D (Ergocalciferol) 50000 units Caps capsule  Commonly known as:  DRISDOL  Take 1 capsule (50,000 Units total) by mouth once a week. For 12 weeks        Allergies: No Known Allergies  Family History: Family History  Problem Relation Age of Onset  . Stroke Father   . Hypertension Father     Social History:  reports that he quit smoking about 4 years ago. He has never used smokeless tobacco. He reports that he drinks alcohol. He reports that he does not use illicit drugs.   Physical Exam: BP 121/78 mmHg  Pulse 79  Ht 5\' 8"  (1.727 m)  Wt 249 lb (112.946 kg)  BMI 37.87 kg/m2  Constitutional:  Alert and oriented, No acute distress. HEENT: [place] AT, moist mucus membranes.  Trachea midline, no masses. Cardiovascular: No clubbing,  cyanosis, or edema. Respiratory: Normal respiratory effort, no increased work of breathing. GI: Abdomen is soft, nontender, nondistended, no abdominal masses GU: No CVA tenderness.  Normal circumcised phallus with orthotopic meatus. Skin: No rashes, bruises or suspicious lesions. Neurologic: Grossly intact, no focal deficits, moving all 4 extremities. Psychiatric: Normal mood and affect.  Laboratory Data: Lab Results  Component Value Date   WBC 5.6 12/30/2014   HGB 15.0 10/24/2013   HCT 44.4 12/30/2014   MCV 91 12/30/2014   PLT 173 12/30/2014    Lab Results  Component Value Date   CREATININE 0.80 12/30/2014   Urinalysis Results for orders placed or performed in visit on 08/15/15  Microscopic Examination  Result Value Ref Range   WBC, UA 0-5 0 -  5 /hpf   RBC, UA 0-2 0 -  2 /hpf   Epithelial Cells (non renal) None seen 0 - 10 /hpf   Bacteria, UA Few (A) None seen/Few  Urinalysis, Complete  Result Value Ref Range   Specific Gravity, UA 1.025 1.005 - 1.030   pH, UA 6.0 5.0 - 7.5   Color, UA Yellow Yellow   Appearance Ur Clear Clear   Leukocytes, UA Trace (A) Negative   Protein, UA Trace (A) Negative/Trace   Glucose, UA Negative Negative   Ketones, UA Negative Negative  RBC, UA Negative Negative   Bilirubin, UA Negative Negative   Urobilinogen, Ur 0.2 0.2 - 1.0 mg/dL   Nitrite, UA Negative Negative   Microscopic Examination See below:      Cystoscopy Procedure Note  Patient identification was confirmed, informed consent was obtained, and patient was prepped using Betadine solution.  Lidocaine jelly was administered per urethral meatus.    Preoperative abx where received prior to procedure.     Pre-Procedure: - Inspection reveals a normal caliber ureteral meatus.  Procedure: The flexible cystoscope was introduced without difficulty   Upon advancement of the scope, and approximately 8 [place] pinpoint bulbar stricture was encountered which was unable to be  traversed with the cystoscope. Patient was not willing to tolerate office dilation.   Post-Procedure: - Patient tolerated the procedure well   Assessment & Plan:    1. Bulbous urethral stricture, unspecified stricture type  Patient's history and cystoscopic exam today confirmed the diagnosis of a fairly significant bulbar urethral stricture. I have recommended proceeding to the operating room for retrograde pyelogram to assess the degree and length of his stricture. I also discussed today DVIU versus urethral dilation in the OR requiring postoperative Foley catheter 1 week. We discussed the risks and benefits of the procedure.  The patient was very hesitant to agree to proceed. He had numerous questions about anesthesia and a lot of anxiety about this. I reassured him and offered him to sit down with one of our anesthesiologist to further discuss this.  He does not think that he like to proceed with this operation until the summer time.  I warned  the patient that without intervention, his obstruction could continue to worsen and he may have difficulty urinating and emptying his bladder. Possible complications may include renal failure, bladder decompensation, urinary tract infections, bladder stones, etc. I urged him to proceed with intervention as soon as possible. I've also offered him a second opinion. He stated that he will call our office when he is ready to proceed. He will need to be seen back in the office within 30 days prior to any intervention per [facility] rules.    - Urinalysis, Complete - ciprofloxacin (CIPRO) tablet 500 mg; Take 1 tablet (500 mg total) by mouth once. - lidocaine (XYLOCAINE) 2 % jelly 1 application; Place 1 application into the urethra once.    Vanna Scotland, MD  [facility] [address] [place], [place] [phone]

## 2015-08-23 ENCOUNTER — Other Ambulatory Visit: Payer: Self-pay | Admitting: Obstetrics and Gynecology

## 2015-09-05 ENCOUNTER — Telehealth: Payer: Self-pay | Admitting: Urology

## 2015-09-05 NOTE — Telephone Encounter (Signed)
Mr. Alexander Valenzuela came into the office saying he's ready to schedule his surgery. Does he need an appointment before we're able to schedule? Please advise. In addition, he mentioned he wants the anesthesia that will cover HALF of his body instead of whole. Please give him a call in regards to this.  Pt's ph# [phone] Thank you.

## 2015-09-07 NOTE — Telephone Encounter (Signed)
Notified pt of surgery scheduled 09/26/15, pre-admit phone interview on 09/12/15 between 9am-1pm and to call day prior to surgery for arrival time to SDS. Advised pt to hold ASA 81mg  7 days prior to surgery per Dr Apolinar JunesBrandon. Pt voices understanding. Pt states he prefers spinal anesthesia. This is appropriate per Dr Apolinar JunesBrandon.

## 2015-09-12 ENCOUNTER — Other Ambulatory Visit: Payer: Self-pay

## 2015-09-12 ENCOUNTER — Encounter: Payer: Self-pay | Admitting: *Deleted

## 2015-09-12 NOTE — Patient Instructions (Signed)
  Your procedure is scheduled on: 09-26-15 Report to [facility] 2ND FLOOR To find out your arrival time please call [phone] between 1PM - 3PM on 09-25-15  Remember: Instructions that are not followed completely may result in serious medical risk, up to and including death, or upon the discretion of your surgeon and anesthesiologist your surgery may need to be rescheduled.    _X___ 1. Do not eat food or drink liquids after midnight. No gum chewing or hard candies.     _X___ 2. No Alcohol for 24 hours before or after surgery.   ____ 3. Bring all medications with you on the day of surgery if instructed.    ____ 4. Notify your doctor if there is any change in your medical condition     (cold, fever, infections).     Do not wear jewelry, make-up, hairpins, clips or nail polish.  Do not wear lotions, powders, or perfumes. You may wear deodorant.  Do not shave 48 hours prior to surgery. Men may shave face and neck.  Do not bring valuables to the hospital.    [facility] is not responsible for any belongings or valuables.               Contacts, dentures or bridgework may not be worn into surgery.  Leave your suitcase in the car. After surgery it may be brought to your room.  For patients admitted to the hospital, discharge time is determined by your treatment team.   Patients discharged the day of surgery will not be allowed to drive home.   Please read over the following fact sheets that you were given:      ____ Take these medicines the morning of surgery with A SIP OF WATER:    1. NONE  2.   3.   4.  5.  6.  ____ Fleet Enema (as directed)   ____ Use CHG Soap as directed  ____ Use inhalers on the day of surgery  ____ Stop metformin 2 days prior to surgery    ____ Take 1/2 of usual insulin dose the night before surgery and none on the morning of surgery.   _X___ Stop Coumadin/Plavix/aspirin-STOP CHILDRENS ASA 7 DAYS PRIOR TO SURGERY  ____ Stop  Anti-inflammatories-NO NSAIDS OR ASA PRODUCTS-TYLENOL OK TO TAKE   ____ Stop supplements until after surgery.    ____ Bring C-Pap to the hospital.

## 2015-09-19 ENCOUNTER — Other Ambulatory Visit: Payer: Self-pay

## 2015-09-19 ENCOUNTER — Other Ambulatory Visit: Payer: Self-pay | Admitting: Radiology

## 2015-09-19 DIAGNOSIS — R3912 Poor urinary stream: Secondary | ICD-10-CM

## 2015-09-20 ENCOUNTER — Other Ambulatory Visit: Payer: 59

## 2015-09-20 DIAGNOSIS — R3912 Poor urinary stream: Secondary | ICD-10-CM

## 2015-09-20 LAB — MICROSCOPIC EXAMINATION
BACTERIA UA: NONE SEEN
RBC, UA: NONE SEEN /hpf (ref 0–?)

## 2015-09-20 LAB — URINALYSIS, COMPLETE
BILIRUBIN UA: NEGATIVE
Ketones, UA: NEGATIVE
LEUKOCYTES UA: NEGATIVE
Nitrite, UA: NEGATIVE
PROTEIN UA: NEGATIVE
RBC, UA: NEGATIVE
SPEC GRAV UA: 1.025 (ref 1.005–1.030)
Urobilinogen, Ur: 0.2 mg/dL (ref 0.2–1.0)
pH, UA: 6.5 (ref 5.0–7.5)

## 2015-09-22 LAB — CULTURE, URINE COMPREHENSIVE

## 2015-09-26 ENCOUNTER — Ambulatory Visit: Payer: 59 | Admitting: Anesthesiology

## 2015-09-26 ENCOUNTER — Encounter: Payer: Self-pay | Admitting: *Deleted

## 2015-09-26 ENCOUNTER — Ambulatory Visit
Admission: RE | Admit: 2015-09-26 | Discharge: 2015-09-26 | Disposition: A | Discharge status: Home / Self Care | Payer: 59 | Source: Ambulatory Visit | Attending: Urology | Admitting: Urology

## 2015-09-26 ENCOUNTER — Encounter
Admission: RE | Disposition: A | Discharge status: Home / Self Care | Payer: Self-pay | Source: Ambulatory Visit | Attending: Urology

## 2015-09-26 ENCOUNTER — Telehealth: Payer: Self-pay | Admitting: Radiology

## 2015-09-26 DIAGNOSIS — Z7982 Long term (current) use of aspirin: Secondary | ICD-10-CM | POA: Insufficient documentation

## 2015-09-26 DIAGNOSIS — Z823 Family history of stroke: Secondary | ICD-10-CM | POA: Diagnosis not present

## 2015-09-26 DIAGNOSIS — Z8249 Family history of ischemic heart disease and other diseases of the circulatory system: Secondary | ICD-10-CM | POA: Insufficient documentation

## 2015-09-26 DIAGNOSIS — Z79899 Other long term (current) drug therapy: Secondary | ICD-10-CM | POA: Insufficient documentation

## 2015-09-26 DIAGNOSIS — N359 Urethral stricture, unspecified: Secondary | ICD-10-CM | POA: Diagnosis not present

## 2015-09-26 DIAGNOSIS — N35011 Post-traumatic bulbous urethral stricture: Secondary | ICD-10-CM | POA: Diagnosis not present

## 2015-09-26 DIAGNOSIS — R7303 Prediabetes: Secondary | ICD-10-CM | POA: Insufficient documentation

## 2015-09-26 DIAGNOSIS — N368 Other specified disorders of urethra: Secondary | ICD-10-CM | POA: Insufficient documentation

## 2015-09-26 DIAGNOSIS — N358 Other urethral stricture: Secondary | ICD-10-CM

## 2015-09-26 HISTORY — PX: CYSTOSCOPY WITH URETHRAL DILATATION: SHX5125

## 2015-09-26 HISTORY — PX: CYSTOSCOPY WITH RETROGRADE URETHROGRAM: SHX6309

## 2015-09-26 LAB — GLUCOSE, CAPILLARY
GLUCOSE-CAPILLARY: 105 mg/dL — AB (ref 65–99)
Glucose-Capillary: 100 mg/dL — ABNORMAL HIGH (ref 65–99)

## 2015-09-26 SURGERY — CYSTOSCOPY WITH RETROGRADE URETHROGRAM
Anesthesia: Spinal | Wound class: Clean Contaminated

## 2015-09-26 MED ORDER — SODIUM CHLORIDE 0.9 % IV SOLN
INTRAVENOUS | Status: DC
  Administered 2015-09-26: 10:00:00 via INTRAVENOUS

## 2015-09-26 MED ORDER — FENTANYL CITRATE (PF) 100 MCG/2ML IJ SOLN: 25.0000 ug | INTRAMUSCULAR | Status: DC | PRN

## 2015-09-26 MED ORDER — ONDANSETRON HCL 4 MG/2ML IJ SOLN: 4.0000 mg | Freq: Once | INTRAMUSCULAR | Status: DC | PRN

## 2015-09-26 MED ORDER — HYDROCODONE-ACETAMINOPHEN 5-325 MG PO TABS: 1.0000 | ORAL_TABLET | Freq: Four times a day (QID) | ORAL | Status: AC | PRN

## 2015-09-26 MED ORDER — OXYBUTYNIN CHLORIDE 5 MG PO TABS: 5.0000 mg | ORAL_TABLET | Freq: Three times a day (TID) | ORAL | Status: AC | PRN

## 2015-09-26 MED ORDER — FAMOTIDINE 20 MG PO TABS
20.0000 mg | ORAL_TABLET | Freq: Once | ORAL | Status: AC
  Administered 2015-09-26: 20 mg via ORAL

## 2015-09-26 MED ORDER — SODIUM CHLORIDE 0.9 % IV SOLN
250.0000 mg | INTRAVENOUS | Status: DC | PRN
  Administered 2015-09-26: 7.5 ug/kg/min via INTRAVENOUS

## 2015-09-26 MED ORDER — BUPIVACAINE IN DEXTROSE 0.75-8.25 % IT SOLN
INTRATHECAL | Status: DC | PRN
  Administered 2015-09-26: 1.2 mL via INTRATHECAL

## 2015-09-26 MED ORDER — MIDAZOLAM HCL 5 MG/5ML IJ SOLN
INTRAMUSCULAR | Status: DC | PRN
  Administered 2015-09-26: 2 mg via INTRAVENOUS

## 2015-09-26 MED ORDER — PROPOFOL 500 MG/50ML IV EMUL
INTRAVENOUS | Status: DC | PRN
  Administered 2015-09-26: 75 ug/kg/min via INTRAVENOUS

## 2015-09-26 MED ORDER — CEFAZOLIN SODIUM-DEXTROSE 2-4 GM/100ML-% IV SOLN
2.0000 g | Freq: Once | INTRAVENOUS | Status: AC
  Administered 2015-09-26: 2 g via INTRAVENOUS

## 2015-09-26 MED ORDER — PROPOFOL 10 MG/ML IV BOLUS
INTRAVENOUS | Status: DC | PRN
  Administered 2015-09-26 (×2): 23 mg via INTRAVENOUS

## 2015-09-26 MED ORDER — FAMOTIDINE 20 MG PO TABS
ORAL_TABLET | ORAL | Status: AC
  Filled 2015-09-26: qty 1

## 2015-09-26 MED ORDER — KETAMINE HCL 50 MG/ML IJ SOLN
INTRAMUSCULAR | Status: DC | PRN
  Administered 2015-09-26 (×2): 2.3 mg via INTRAMUSCULAR

## 2015-09-26 MED ORDER — CEFAZOLIN SODIUM-DEXTROSE 2-4 GM/100ML-% IV SOLN
INTRAVENOUS | Status: AC
  Filled 2015-09-26: qty 100

## 2015-09-26 SURGICAL SUPPLY — 21 items
CATH FOL 2WAY LX 16X5 (CATHETERS) ×3 IMPLANT
CATH FOLEY 2W COUNCIL 5CC 18FR (CATHETERS) ×3 IMPLANT
DRESSING TELFA 4X3 1S ST N-ADH (GAUZE/BANDAGES/DRESSINGS) ×3 IMPLANT
ELECT REM PT RETURN 9FT ADLT (ELECTROSURGICAL) ×3
ELECTRODE REM PT RTRN 9FT ADLT (ELECTROSURGICAL) ×1 IMPLANT
GLOVE BIO SURGEON STRL SZ 6.5 (GLOVE) ×2 IMPLANT
GLOVE BIO SURGEON STRL SZ7 (GLOVE) ×6 IMPLANT
GLOVE BIO SURGEONS STRL SZ 6.5 (GLOVE) ×1
GOWN STRL REUS W/ TWL LRG LVL3 (GOWN DISPOSABLE) ×2 IMPLANT
GOWN STRL REUS W/TWL LRG LVL3 (GOWN DISPOSABLE) ×4
NDL SAFETY ECLIPSE 18X1.5 (NEEDLE) ×1 IMPLANT
NEEDLE HYPO 18GX1.5 SHARP (NEEDLE) ×2
PACK CYSTO AR (MISCELLANEOUS) ×3 IMPLANT
PREP PVP WINGED SPONGE (MISCELLANEOUS) ×3 IMPLANT
SENSORWIRE 0.038 NOT ANGLED (WIRE) ×3
SET CYSTO W/LG BORE CLAMP LF (SET/KITS/TRAYS/PACK) ×3 IMPLANT
SYR 30ML LL (SYRINGE) ×3 IMPLANT
SYRINGE 10CC LL (SYRINGE) ×3 IMPLANT
WATER STERILE IRR 1000ML POUR (IV SOLUTION) ×3 IMPLANT
WATER STERILE IRR 3000ML UROMA (IV SOLUTION) ×3 IMPLANT
WIRE SENSOR 0.038 NOT ANGLED (WIRE) ×1 IMPLANT

## 2015-09-26 NOTE — Anesthesia Postprocedure Evaluation (Signed)
Anesthesia Post Note  Patient: Patrecia PaceVictor S Care  Procedure(s) Performed: Procedure(s) (LRB): CYSTOSCOPY WITH RETROGRADE URETHROGRAM (N/A) CYSTOSCOPY WITH DVIU AND URETHERAL BIOPSY (N/A)  Patient location during evaluation: PACU Anesthesia Type: General Level of consciousness: awake and alert Pain management: pain level controlled Vital Signs Assessment: post-procedure vital signs reviewed and stable Respiratory status: spontaneous breathing, nonlabored ventilation, respiratory function stable and patient connected to nasal cannula oxygen Cardiovascular status: blood pressure returned to baseline and stable Postop Assessment: no signs of nausea or vomiting Anesthetic complications: no    Last Vitals:  Filed Vitals:   09/26/15 1225 09/26/15 1242  BP: 125/90 123/91  Pulse: 52 59  Temp: 35.6 C   Resp: 18 18    Last Pain: There were no vitals filed for this visit.               Lenard SimmerAndrew Labrandon Knoch

## 2015-09-26 NOTE — Anesthesia Preprocedure Evaluation (Signed)
Anesthesia Evaluation  Patient identified by MRN, date of birth, ID band Patient awake    Reviewed: Allergy & Precautions, H&P , NPO status , Patient's Chart, lab work & pertinent test results, reviewed documented beta blocker date and time   History of Anesthesia Complications Negative for: history of anesthetic complications  Airway Mallampati: III  TM Distance: >3 FB Neck ROM: full    Dental no notable dental hx. (+) Caps, Chipped, Missing   Pulmonary neg shortness of breath, sleep apnea , neg COPD, neg recent URI, Current Smoker,    Pulmonary exam normal breath sounds clear to auscultation       Cardiovascular Exercise Tolerance: Good negative cardio ROS Normal cardiovascular exam Rhythm:regular Rate:Normal     Neuro/Psych negative neurological ROS  negative psych ROS   GI/Hepatic negative GI ROS, Neg liver ROS,   Endo/Other  diabetes (Borderline)  Renal/GU negative Renal ROS  negative genitourinary   Musculoskeletal   Abdominal   Peds  Hematology negative hematology ROS (+)   Anesthesia Other Findings Past Medical History:   Hyperglycemia                                                  Comment:HGB A1C WAS 6.0 ON 06-30-15-NO MEDS-PCP WATCHING              AIC   Reproductive/Obstetrics negative OB ROS                             Anesthesia Physical Anesthesia Plan  ASA: II  Anesthesia Plan: Spinal   Post-op Pain Management:    Induction:   Airway Management Planned:   Additional Equipment:   Intra-op Plan:   Post-operative Plan:   Informed Consent: I have reviewed the patients History and Physical, chart, labs and discussed the procedure including the risks, benefits and alternatives for the proposed anesthesia with the patient or authorized representative who has indicated his/her understanding and acceptance.   Dental Advisory Given  Plan Discussed with:  Anesthesiologist, CRNA and Surgeon  Anesthesia Plan Comments:         Anesthesia Quick Evaluation

## 2015-09-26 NOTE — Transfer of Care (Signed)
Immediate Anesthesia Transfer of Care Note  Patient: Alexander Valenzuela  Procedure(s) Performed: Procedure(s): CYSTOSCOPY WITH RETROGRADE URETHROGRAM (N/A) CYSTOSCOPY WITH DVIU AND URETHERAL BIOPSY (N/A)  Patient Location: PACU  Anesthesia Type:Spinal  Level of Consciousness: awake, alert , oriented and patient cooperative  Airway & Oxygen Therapy: Patient Spontanous Breathing and Patient connected to nasal cannula oxygen  Post-op Assessment: Report given to RN and Post -op Vital signs reviewed and stable  Post vital signs: Reviewed and stable  Last Vitals:  Filed Vitals:   09/26/15 0904 09/26/15 1058  BP: 128/77 103/70  Pulse: 67 71  Temp: 36.7 C 36 C  Resp: 18 39    Last Pain: There were no vitals filed for this visit.       Complications: No apparent anesthesia complications

## 2015-09-26 NOTE — Progress Notes (Signed)
Upon standing up to get dressed, left foot is numb and unable to bear weight on it. Assisted to sit down and remain in post-op area until numbness wears off.

## 2015-09-26 NOTE — Discharge Instructions (Signed)
Foley Catheter Care, Adult A Foley catheter is a soft, flexible tube. This tube is placed into your bladder to drain pee (urine). If you go home with this catheter in place, follow the instructions below. TAKING CARE OF THE CATHETER  Wash your hands with soap and water.  Put soap and water on a clean washcloth.  Clean the skin where the tube goes into your body.  Clean away from the tube site.  Never wipe toward the tube.  Clean the area using a circular motion.  Remove all the soap. Pat the area dry with a clean towel. For males, reposition the skin that covers the end of the penis (foreskin).  Attach the tube to your leg with tape or a leg strap. Do not stretch the tube tight. If you are using tape, remove any stickiness left behind by past tape you used.  Keep the drainage bag below your hips. Keep it off the floor.  Check your tube during the day. Make sure it is working and draining. Make sure the tube does not curl, twist, or bend.  Do not pull on the tube or try to take it out. TAKING CARE OF THE DRAINAGE BAGS You will have a large overnight drainage bag and a small leg bag. You may wear the overnight bag any time. Never wear the small bag at night. Follow the directions below. Emptying the Drainage Bag Empty your drainage bag when it is  - full or at least 2-3 times a day.  Wash your hands with soap and water.  Keep the drainage bag below your hips.  Hold the dirty bag over the toilet or clean container.  Open the pour spout at the bottom of the bag. Empty the pee into the toilet or container. Do not let the pour spout touch anything.  Clean the pour spout with a gauze pad or cotton ball that has rubbing alcohol on it.  Close the pour spout.  Attach the bag to your leg with tape or a leg strap.  Wash your hands well. Changing the Drainage Bag Change your bag once a month or sooner if it starts to smell or look dirty.   Wash your hands with soap and  water.  Pinch the rubber tube so that pee does not spill out.  Disconnect the catheter tube from the drainage tube at the connection valve. Do not let the tubes touch anything.  Clean the end of the catheter tube with an alcohol wipe. Clean the end of a the drainage tube with a different alcohol wipe.  Connect the catheter tube to the drainage tube of the clean drainage bag.  Attach the new bag to the leg with tape or a leg strap. Avoid attaching the new bag too tightly.  Wash your hands well. Cleaning the Drainage Bag  Wash your hands with soap and water.  Wash the bag in warm, soapy water.  Rinse the bag with warm water.  Fill the bag with a mixture of white vinegar and water (1 cup vinegar to 1 quart warm water [.2 liter vinegar to 1 liter warm water]). Close the bag and soak it for 30 minutes in the solution.  Rinse the bag with warm water.  Hang the bag to dry with the pour spout open and hanging downward.  Store the clean bag (once it is dry) in a clean plastic bag.  Wash your hands well. PREVENT INFECTION  Wash your hands before and after touching your tube.  Take showers every day. Wash the skin where the tube enters your body. Do not take baths. Replace wet leg straps with dry ones, if this applies.  Do not use powders, sprays, or lotions on the genital area. Only use creams, lotions, or ointments as told by your doctor.  For females, wipe from front to back after going to the bathroom.  Drink enough fluids to keep your pee clear or pale yellow unless you are told not to have too much fluid (fluid restriction).  Do not let the drainage bag or tubing touch or lie on the floor.  Wear cotton underwear to keep the area dry. GET HELP IF:  Your pee is cloudy or smells unusually bad.  Your tube becomes clogged.  You are not draining pee into the bag or your bladder feels full.  Your tube starts to leak. GET HELP RIGHT AWAY IF:  You have pain, puffiness  (swelling), redness, or yellowish-white fluid (pus) where the tube enters the body.  You have pain in the belly (abdomen), legs, lower back, or bladder.  You have a fever.  You see blood fill the tube, or your pee is pink or red.  You feel sick to your stomach (nauseous), throw up (vomit), or have chills.  Your tube gets pulled out. MAKE SURE YOU:   Understand these instructions.  Will watch your condition.  Will get help right away if you are not doing well or get worse.   This information is not intended to replace advice given to you by your health care provider. Make sure you discuss any questions you have with your health care provider.   Document Released: 08/31/2012 Document Revised: 05/27/2014 Document Reviewed: 08/31/2012 Elsevier Interactive Patient Education 2016 Elsevier Inc.  Spinal Anesthesia and Epidural Anesthesia, Care After Refer to this sheet in the next few weeks. These instructions provide you with information about caring for yourself after your procedure. Your health care provider may also give you more specific instructions. Your treatment has been planned according to current medical practices, but problems sometimes occur. Call your health care provider if you have any problems or questions after your procedure. WHAT TO EXPECT AFTER THE PROCEDURE After your procedure, it is typical to have the following:  Sleepiness.  Nausea and vomiting. HOME CARE INSTRUCTIONS  For the first 24 hours after anesthetic medicine:  Do not drive or operate heavy machinery.  Do not drink alcohol.  Do not make important decisions.  Have someone stay with you for at least 24-48 hours.  Drink enough fluid to keep your urine clear or pale yellow. SEEK MEDICAL CARE IF:  You have nausea and vomiting that continue the day after anesthetic medicine was given.  You develop a rash. SEEK IMMEDIATE MEDICAL CARE IF:   You have a fever.  You have a persistent or severe  headache.  You develop blurred or double vision.  You develop dizziness or lightheadedness.  You faint.  You have weakness, numbness, or tingling in your arms or legs.  You have difficulty breathing.  You are unable to pass urine.   This information is not intended to replace advice given to you by your health care provider. Make sure you discuss any questions you have with your health care provider.   Document Released: 07/27/2003 Document Revised: 05/27/2014 Document Reviewed: 12/15/2013 Elsevier Interactive Patient Education [employer] Elsevier Inc.

## 2015-09-26 NOTE — Op Note (Signed)
Date of procedure: 09/26/2015  Preoperative diagnosis:  1. Bulbar urethral stricture 2. Low semen volume   Postoperative diagnosis:  1. Same as above   Procedure: 1. Retrograde urethrogram 2. DVIU 3. Urethral biopsy  Surgeon: Vanna Scotland, MD  Anesthesia: General  Complications: None  Intraoperative findings: 1 cm ~6 Fr bulbar urethral stricture.  Irregular, whitish caked on appearance urethral mucosa just proximal to strictured area.   EBL: minimal     Specimens: urethral biopsy     Drains: 18 Fr council tip Foley catheter  Indication: Alexander Valenzuela is a 40 y.o. patient with decreased urinary stream and ejaculate volume found to have a tight bulbar urethral stricture.  After reviewing the management options for treatment, he elected to proceed with the above surgical procedure(s). We have discussed the potential benefits and risks of the procedure, side effects of the proposed treatment, the likelihood of the patient achieving the goals of the procedure, and any potential problems that might occur during the procedure or recuperation. Informed consent has been obtained.  Description of procedure:  The patient was taken to the operating room and spinal anesthesia was induced.  The patient was placed in the lateral decubitus position, prepped and draped in the usual sterile fashion, and preoperative antibiotics were administered. A preoperative time-out was performed.   Using a catheter tip syringe and a Christmas tree adapter, a gentle retrograde urethrogram was performed placing the patient's peak phallus on stretch and slowly injecting contrast, half diluted. This revealed a significant bulbar urethral stricture measuring 1 cm in length with a small area of saccular dilation just proximal to this. Remainder of the urethra was normal. No contrast extravasation. The bladder contour was regular.  The patient was then repositioned in the dorsal lithotomy position and reprepped  and draped. Male urethral dilators were used to tightly dilate the urethral meatus in order to accommodate the 21 French cystoscope. The scope was advanced to the bulbar urethra where the structure was encountered. Approximately 6 French in diameter. A sensor wire was then advanced through the strictured area under direct visualization. The scope was then removed. The urethrotome using a 0 lens was then advanced up to the area of the urethral stricture. A cold blade was then used to incise the strictured area at the 12:00 position. This allowed for the stricture to spring open widely and the scope to advance beyond this strictured area. Just proximal to the strictured area, the urethral mucosa was irregular with a whitish caked on appearance.  Photographs are taken of this irregularity.  Given this fairly irregular finding, cold cup biopsy forceps were used to gently biopsy a few areas of this whitish debris. There was no bleeding at the time of biopsy. These were passed off the field as urethral biopsy.  The scope was then advanced through the prostatic urethra which was normal with normal with normal mucosa. The bladder itself was also normal without significant debris, trabeculation, or any other concerning findings. The trigone was in an anatomic position and the UOs were able to be identified well. The scope was then removed. An 43 French council-tip catheter was advanced over the wire into the bladder. The wire was withdrawn and the balloon was ablated with 10 cc of sterile water. The patient was then repositioned the supine position and taken to the PACU in stable condition.        Plan: Patient will keep his catheter for approximate 10 days. We will call him with his pathology  results. I like to see him in clinic in approximately 4 weeks for uroflow/possible cystoscopy.  Vanna ScotlandAshley Suleyman Valenzuela, M.D.

## 2015-09-26 NOTE — Anesthesia Procedure Notes (Signed)
Spinal Patient location during procedure: OR Start time: 09/26/2015 10:07 AM End time: 09/26/2015 10:12 AM Staffing Anesthesiologist: Martha Clan Performed by: anesthesiologist  Preanesthetic Checklist Completed: patient identified, site marked, surgical consent, pre-op evaluation, timeout performed, IV checked, risks and benefits discussed and monitors and equipment checked Spinal Block Patient position: sitting Prep: ChloraPrep Patient monitoring: heart rate, continuous pulse ox, blood pressure and cardiac monitor Approach: midline Location: L4-5 Injection technique: single-shot Needle Needle type: Whitacre and Introducer  Needle gauge: 25 G Needle length: 9 cm Additional Notes Negative paresthesia. Negative blood return. Positive free-flowing CSF. Expiration date of kit checked and confirmed. Patient tolerated procedure well, without complications.

## 2015-09-26 NOTE — Telephone Encounter (Signed)
Returned patient's call and per Dr. Apolinar JunesBrandon told him to take 1 tylenol and to keep monitoring his temp. If his temp is 101 or greater to call our office back for further instruction.

## 2015-09-26 NOTE — H&P (Signed)
Expand All Collapse All     08/15/2015 3:10 PM   Alexander Valenzuela [facility] 1975/11/29 [id number]  Referring provider: Ellyn Hack, MD [address] [place], [place] [address]  Chief Complaint  Patient presents with  . Cysto    HPI: 40 year old male with a history of weak urinary stream and decreased ejaculate volume. As part of the workup, he presents today for cystoscopy to rule out urethral stricture.  He does note today that history and has been worsening and caliber over the past few years. He does feel like he is able to empty his bladder completely.  Previous uroflow show s a peak flow of only 3 mL's per second with a very prolonged voiding time. Also shows an obstructive voiding pattern.  PVR previously 108 mL.  Patient denies a history of sexual transmitted infections and urethral/perineal trauma.   PMH: Past Medical History  Diagnosis Date  . Hyperglycemia     Surgical History: No past surgical history on file.  Home Medications:    Medication List       This list is accurate as of: 08/15/15 11:59 PM. Always use your most recent med list.              ASPIRIN CHILDRENS PO  Take by mouth. Reported on 08/02/2015     Vitamin D (Ergocalciferol) 50000 units Caps capsule  Commonly known as: DRISDOL  Take 1 capsule (50,000 Units total) by mouth once a week. For 12 weeks        Allergies: No Known Allergies  Family History: Family History  Problem Relation Age of Onset  . Stroke Father   . Hypertension Father     Social History:  reports that he quit smoking about 4 years ago. He has never used smokeless tobacco. He reports that he drinks alcohol. He reports that he does not use illicit drugs.   Physical Exam: BP 121/78 mmHg  Pulse 79  Ht  (1.727 m)  Wt 249 lb (112.946 kg)  BMI 37.87 kg/m2  Constitutional: Alert and oriented, No acute distress. HEENT: [place] AT, moist mucus  membranes. Trachea midline, no masses. Cardiovascular: No clubbing, cyanosis, or edema. Respiratory: Normal respiratory effort, no increased work of breathing. GI: Abdomen is soft, nontender, nondistended, no abdominal masses GU: No CVA tenderness. Normal circumcised phallus with orthotopic meatus. Skin: No rashes, bruises or suspicious lesions. Neurologic: Grossly intact, no focal deficits, moving all 4 extremities. Psychiatric: Normal mood and affect.  Laboratory Data:  Recent Labs    Lab Results  Component Value Date   WBC 5.6 12/30/2014   HGB 15.0 10/24/2013   HCT 44.4 12/30/2014   MCV 91 12/30/2014   PLT 173 12/30/2014       Recent Labs    Lab Results  Component Value Date   CREATININE 0.80 12/30/2014     Urinalysis Results for orders placed or performed in visit on 08/15/15  Microscopic Examination  Result Value Ref Range   WBC, UA 0-5 0 - 5 /hpf   RBC, UA 0-2 0 - 2 /hpf   Epithelial Cells (non renal) None seen 0 - 10 /hpf   Bacteria, UA Few (A) None seen/Few  Urinalysis, Complete  Result Value Ref Range   Specific Gravity, UA 1.025 1.005 - 1.030   pH, UA 6.0 5.0 - 7.5   Color, UA Yellow Yellow   Appearance Ur Clear Clear   Leukocytes, UA Trace (A) Negative   Protein, UA Trace (A) Negative/Trace  Glucose, UA Negative Negative   Ketones, UA Negative Negative   RBC, UA Negative Negative   Bilirubin, UA Negative Negative   Urobilinogen, Ur 0.2 0.2 - 1.0 mg/dL   Nitrite, UA Negative Negative   Microscopic Examination See below:      Cystoscopy Procedure Note  Patient identification was confirmed, informed consent was obtained, and patient was prepped using Betadine solution. Lidocaine jelly was administered per urethral meatus.   Preoperative abx where received prior to procedure.    Pre-Procedure: - Inspection reveals a normal caliber  ureteral meatus.  Procedure: The flexible cystoscope was introduced without difficulty   Upon advancement of the scope, and approximately 8 [place]French pinpoint bulbar stricture was encountered which was unable to be traversed with the cystoscope. Patient was not willing to tolerate office dilation.   Post-Procedure: - Patient tolerated the procedure well   Assessment & Plan:   1. Bulbous urethral stricture, unspecified stricture type Patient's history and cystoscopic exam today confirmed the diagnosis of a fairly significant bulbar urethral stricture. I have recommended proceeding to the operating room for retrograde pyelogram to assess the degree and length of his stricture. I also discussed today DVIU versus urethral dilation in the OR requiring postoperative Foley catheter 1 week. We discussed the risks and benefits of the procedure.  The patient was very hesitant to agree to proceed. He had numerous questions about anesthesia and a lot of anxiety about this. I reassured him and offered him to sit down with one of our anesthesiologist to further discuss this. He does not think that he like to proceed with this operation until the summer time.  I warned the patient that without intervention, his obstruction could continue to worsen and he may have difficulty urinating and emptying his bladder. Possible complications may include renal failure, bladder decompensation, urinary tract infections, bladder stones, etc. I urged him to proceed with intervention as soon as possible. I've also offered him a second opinion. He stated that he will call our office when he is ready to proceed. He will need to be seen back in the office within 30 days prior to any intervention per [facility] rules.   - Urinalysis, Complete - ciprofloxacin (CIPRO) tablet 500 mg; Take 1 tablet (500 mg total) by mouth once. - lidocaine (XYLOCAINE) 2 % jelly 1 application; Place 1 application into the urethra once.    Vanna ScotlandAshley  Becka Lagasse, MD  [facility] [address], [address] [place], [place] [address] [phone]        09/26/15 Patient seen and examined today.  No change from last visit.  Desires spinal.    RRR CTAB  Repeat UCx negative.    Vanna ScotlandAshley Filicia Scogin, MD

## 2015-09-26 NOTE — Telephone Encounter (Signed)
Pt had surgery today & c/o being cold with temp of 100.4. Please advise.

## 2015-09-27 LAB — SURGICAL PATHOLOGY

## 2015-09-28 ENCOUNTER — Telehealth: Payer: Self-pay

## 2015-09-28 NOTE — Telephone Encounter (Signed)
Patient called stating that he has not had a bowel movement since surgery and wanted to know if he could take anything to help. Patient states that he has not taken pain medication since Tuesday but was not taking colace along with it. Patient was instructed to try OTC Miralax with a glass of water and if he still was not able to have a bowel movement to then try Magnesium Citrate. Patient verbalized understanding and will give himself some time to see if these work, if they do not he will call us back next week.

## 2015-10-04 ENCOUNTER — Telehealth: Payer: Self-pay

## 2015-10-04 NOTE — Telephone Encounter (Signed)
Pt called stating he had surgery a week ago for a stricture and currently has a foley in place. Pt c/o having a white milky discharge at head of penis. Please advise.

## 2015-10-04 NOTE — Telephone Encounter (Signed)
Per Carollee HerterShannon if pt is uncircumcised then pt should make sure skin is over the glans and if pt is circumcised then he is most likely experiencing normal discharge for a foley to be in place. Carollee HerterShannon also stated that pt should not have a swollen penis at any point. Spoke with pt who stated he is circumcised and his penis is not swollen just red at tip of glans and sore. Reinforced with pt he is experiencing normal symptoms of a foley in place. Pt voiced understanding and requested a ucx to be done on Friday. Reinforced with pt a ucx should not be done as he will most likely receive a false positive due to the foley. Made pt aware pt should have foley removed and wait a couple of days and then if he is still experiencing UTI like symptoms to give us a call and we can do a ucx then. Pt voiced understanding.

## 2015-10-06 ENCOUNTER — Ambulatory Visit: Payer: 59

## 2015-10-11 ENCOUNTER — Telehealth: Payer: Self-pay | Admitting: Urology

## 2015-10-11 NOTE — Telephone Encounter (Signed)
I did book it as a possible cysto so it is correct.  Thanks  [employer]le

## 2015-10-27 ENCOUNTER — Ambulatory Visit (INDEPENDENT_AMBULATORY_CARE_PROVIDER_SITE_OTHER): Payer: 59 | Admitting: Urology

## 2015-10-27 ENCOUNTER — Encounter: Payer: Self-pay | Admitting: Urology

## 2015-10-27 VITALS — BP 118/72 | HR 58 | Ht 68.0 in | Wt 246.2 lb

## 2015-10-27 DIAGNOSIS — N99110 Postprocedural urethral stricture, male, meatal: Secondary | ICD-10-CM | POA: Diagnosis not present

## 2015-10-27 DIAGNOSIS — R8290 Unspecified abnormal findings in urine: Secondary | ICD-10-CM | POA: Insufficient documentation

## 2015-10-27 DIAGNOSIS — Z0000 Encounter for general adult medical examination without abnormal findings: Secondary | ICD-10-CM | POA: Insufficient documentation

## 2015-10-27 DIAGNOSIS — N358 Other urethral stricture: Secondary | ICD-10-CM

## 2015-10-27 DIAGNOSIS — Z23 Encounter for immunization: Secondary | ICD-10-CM | POA: Insufficient documentation

## 2015-10-27 DIAGNOSIS — Z1389 Encounter for screening for other disorder: Secondary | ICD-10-CM | POA: Insufficient documentation

## 2015-10-27 DIAGNOSIS — R739 Hyperglycemia, unspecified: Secondary | ICD-10-CM | POA: Insufficient documentation

## 2015-10-27 DIAGNOSIS — Z139 Encounter for screening, unspecified: Secondary | ICD-10-CM | POA: Insufficient documentation

## 2015-10-27 DIAGNOSIS — N369 Urethral disorder, unspecified: Secondary | ICD-10-CM | POA: Diagnosis not present

## 2015-10-27 DIAGNOSIS — E669 Obesity, unspecified: Secondary | ICD-10-CM | POA: Insufficient documentation

## 2015-10-27 DIAGNOSIS — S61419A Laceration without foreign body of unspecified hand, initial encounter: Secondary | ICD-10-CM | POA: Insufficient documentation

## 2015-10-27 DIAGNOSIS — E559 Vitamin D deficiency, unspecified: Secondary | ICD-10-CM | POA: Insufficient documentation

## 2015-10-27 LAB — URINALYSIS, COMPLETE
BILIRUBIN UA: NEGATIVE
GLUCOSE, UA: NEGATIVE
Ketones, UA: NEGATIVE
LEUKOCYTES UA: NEGATIVE
Nitrite, UA: NEGATIVE
PROTEIN UA: NEGATIVE
RBC UA: NEGATIVE
UUROB: 0.2 mg/dL (ref 0.2–1.0)
pH, UA: 5 (ref 5.0–7.5)

## 2015-10-27 LAB — MICROSCOPIC EXAMINATION
Bacteria, UA: NONE SEEN
RBC, UA: NONE SEEN /hpf (ref 0–?)

## 2015-10-27 LAB — BLADDER SCAN AMB NON-IMAGING: Scan Result: 55

## 2015-10-27 MED ORDER — CIPROFLOXACIN HCL 500 MG PO TABS
500.0000 mg | ORAL_TABLET | Freq: Once | ORAL | Status: AC
  Administered 2015-10-27: 500 mg via ORAL

## 2015-10-27 MED ORDER — LIDOCAINE HCL 2 % EX GEL
1.0000 "application " | Freq: Once | CUTANEOUS | Status: AC
  Administered 2015-10-27: 1 via URETHRAL

## 2015-10-31 ENCOUNTER — Encounter: Payer: Self-pay | Admitting: Urology

## 2015-10-31 NOTE — Progress Notes (Signed)
10/27/2015 11:59 AM   Alexander Valenzuela 08/04/1975 [id number]  Referring provider: Maryland Pink, MD [address] [place], [place] [address]  Chief Complaint  Patient presents with  . 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 Posterior to the stricture, the mucosa was coated in a whitish plaque-like material consistent with care taken on biopsy.  An 48 French Foley catheter remained in place for 1 week and was discontinued thereafter. Since then, he notes an excellent urinary stream for greater caliber than prior. He is also able to ejaculate with significant ejaculatory volume. He has no urinary complaints today. He is pleased with the outcome.  He denies any dysuria, gross hematuria, or any other urinary complaints.  He returns today to the office for repeat office cystoscopy to reevaluate the keratinized urethral mucosa.  PMH: Past Medical History  Diagnosis Date  . Hyperglycemia     HGB A1C WAS 6.0 ON 06-30-15-NO MEDS-PCP WATCHING AIC    Surgical History: Past Surgical History  Procedure Laterality Date  . No past surgeries    . Cystoscopy with retrograde urethrogram N/A 09/26/2015    Procedure: CYSTOSCOPY WITH RETROGRADE URETHROGRAM;  Surgeon: Hollice Espy, MD;  Location: [facility];  Service: Urology;  Laterality: N/A;  . Cystoscopy with urethral dilatation N/A 09/26/2015    Procedure: CYSTOSCOPY WITH DVIU AND URETHERAL BIOPSY;  Surgeon: Hollice Espy, MD;  Location: [facility];  Service: Urology;  Laterality: N/A;    Home Medications:    Medication List       This list is accurate as of: 10/27/15 11:59 PM.  Always use your most recent med list.               ASPIRIN CHILDRENS PO  Take by mouth. Reported on 10/27/2015     HYDROcodone-acetaminophen 5-325 MG tablet  Commonly known as:  NORCO/VICODIN  Take 1-2 tablets  by mouth every 6 (six) hours as needed for moderate pain.     multivitamin tablet  Take 1 tablet by mouth daily.     oxybutynin 5 MG tablet  Commonly known as:  DITROPAN  Take 1 tablet (5 mg total) by mouth every 8 (eight) hours as needed for bladder spasms.        Allergies: No Known Allergies  Family History: Family History  Problem Relation Age of Onset  . Stroke Father   . Hypertension Father     Social History:  reports that he has been smoking Cigars.  He has never used smokeless tobacco. He reports that he drinks alcohol. He reports that he does not use illicit drugs.  Physical Exam: BP 118/72 mmHg  Pulse 58  Ht 5' 8"  (1.727 m)  Wt 246 lb 3.2 oz (111.676 kg)  BMI 37.44 kg/m2  Constitutional:  Alert and oriented, No acute distress. HEENT: [place] AT, moist mucus membranes.  Trachea midline, no masses. Cardiovascular: No clubbing, cyanosis, or edema. Respiratory: Normal respiratory effort, no increased work of breathing. GI: Abdomen is soft, nontender, nondistended, no abdominal masses GU: No CVA tenderness. Circumcised phallus, orthotopic meatus with mild meatal stenosis. Skin: No rashes, bruises or suspicious lesions. Neurologic: Grossly intact, no focal deficits, moving all 4 extremities. Psychiatric: Normal mood and affect.  Laboratory Data: Lab Results  Component Value Date   WBC 5.6 12/30/2014   HGB 15.0 10/24/2013   HCT  44.4 12/30/2014   MCV 91 12/30/2014   PLT 173 12/30/2014    Lab Results  Component Value Date   CREATININE 0.80 12/30/2014   Urinalysis Results for orders placed or performed in visit on 10/27/15  Microscopic Examination  Result Value Ref Range   WBC, UA 0-5 0 -  5 /hpf   RBC, UA None seen 0 -  2 /hpf   Epithelial Cells (non renal) 0-10 0 - 10 /hpf   Bacteria, UA None seen None seen/Few  Urinalysis, Complete  Result Value Ref Range   Specific Gravity, UA <1.005 (L) 1.005 - 1.030   pH, UA 5.0 5.0 - 7.5   Color, UA Yellow Yellow    Appearance Ur Clear Clear   Leukocytes, UA Negative Negative   Protein, UA Negative Negative/Trace   Glucose, UA Negative Negative   Ketones, UA Negative Negative   RBC, UA Negative Negative   Bilirubin, UA Negative Negative   Urobilinogen, Ur 0.2 0.2 - 1.0 mg/dL   Nitrite, UA Negative Negative   Microscopic Examination See below:   Bladder Scan (Post Void Residual) in office  Result Value Ref Range   Scan Result 55     Pertinent Imaging: Post void residual of 55 cc.    Cystoscopy Procedure Note  Patient identification was confirmed, informed consent was obtained, and patient was prepped using Betadine solution.  Lidocaine jelly was administered per urethral meatus.    Preoperative abx where received prior to procedure.     Pre-Procedure: - Inspection reveals a mildly narrow urethral meatus.  Procedure: The flexible cystoscope was attempted to be introduced however resistance was met at the level of the urethral meatus.  A meatal dilator was used gently just within the urethral meatus/fossa navicularis which was not well tolerated by the patient.  Ultimately, I was unable to advance the 57 French flexible scope through the urethral meatus due to the amount meatal stricture.  Post-Procedure: - Patient tolerated the procedure well    Assessment & Plan:    1. Bulbous urethral stricture, unspecified stricture type S/p DVIU, symptomatically improved.  PVR today minimal.   - Urinalysis, Complete - Bladder Scan (Post Void Residual) in office - ciprofloxacin (CIPRO) tablet 500 mg; Take 1 tablet (500 mg total) by mouth once. - lidocaine (XYLOCAINE) 2 % jelly 1 application; Place 1 application into the urethra once.  2. 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  Recommend OR dilation as below.    3. 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 Planned for office  cystoscopy to reevaluate this area but unable to proceed today due to urethral meatal stenosis.  Advised the patient that I would like him to return to the operating room for dilation of his urethral meatus, cystoscopy with potential repeat biopsies of the urethra in order to rule out any urethral malignancy.   Mr. Tardif was very hesitant to proceed with any intervention in the first place and notes hasn't returned to the operating room. I explained the importance of the possibility of missed an untreated condition. We also discussed the risks of the procedure and likely need for repeat Foley catheter. All of his questions were answered today.  He would like to wait another month before pursuing any intervention and will call our office to schedule procedure.  Again, delaying care may lead to possible progression of any undiagnosed disorder. He understands this. He was offered second opinion but declined.  The booking sheet for the above procedure was completed today along with preop orders. He'll call our office and let us know when he is ready to proceed. I will have my office contact him in 1 month to touch base.   Hollice Espy, MD  [facility] [address] [place], [place] [address] [phone]

## 2015-11-06 ENCOUNTER — Telehealth: Payer: Self-pay | Admitting: Urology

## 2015-11-06 DIAGNOSIS — N358 Other urethral stricture: Secondary | ICD-10-CM

## 2015-11-06 NOTE — Telephone Encounter (Signed)
Patient called the office today and stated that you gave him 3 options: 1) do nothing and wait 2) return to the operating room  3) see a more specialized provider  He states that he would like to be referred to someone more specialized.  I did not see the information in the note on where to refer him.    Please advise.

## 2015-11-06 NOTE — Telephone Encounter (Signed)
I actually think this is a great idea.  His urethra needs to be evaluated again and likely rebiopsied.    [facility] this area.  If his [facility] covers [facility], I think this would be the best.  Vanna ScotlandAshley Nathan Stallworth, MD

## 2018-02-14 IMAGING — RF DG ABDOMEN 2V
2 series · 2 of 2 positions shown · non-contrast
Comparison: none

[Series 1: retrogr.  pyelogr. · 1 of 1 slices shown (1 of 2)]
[im 1/1  full-range]
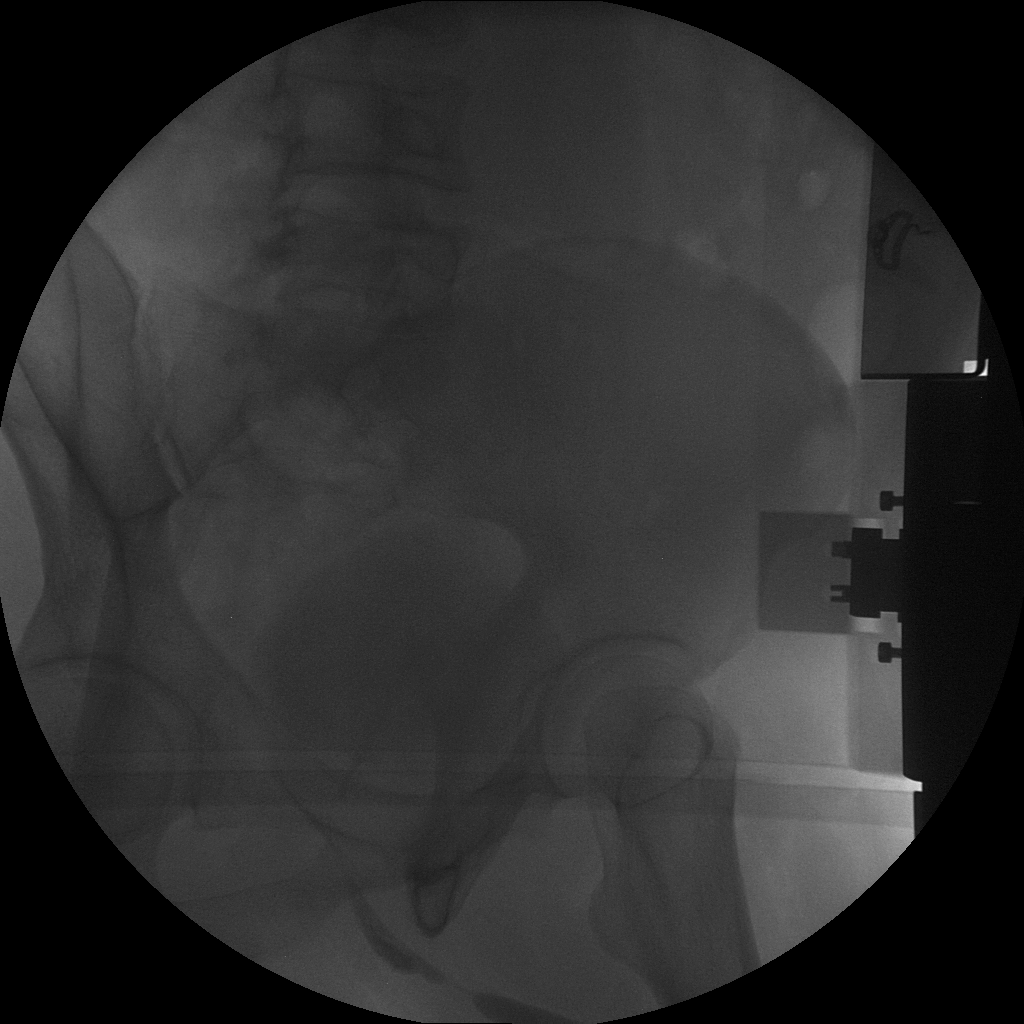

[Series 2: retrogr.  pyelogr. · 1 of 1 slices shown (2 of 2)]
[im 1/1]
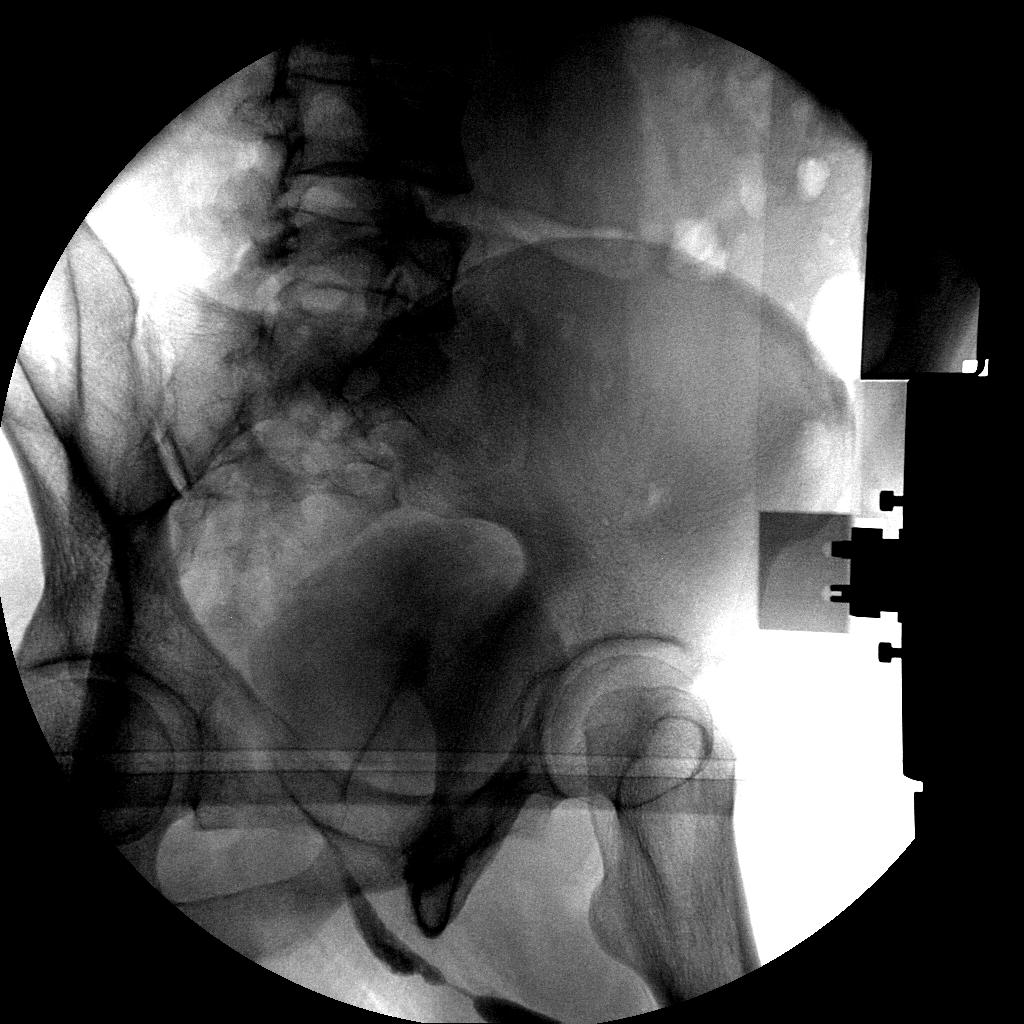

[2 of 2 positions shown; findings below may reference images not displayed]

Canned report from images found in remote index.

Refer to host system for actual result text.
